# Patient Record
Sex: Male | Born: 1945
Health system: Southern US, Community
[De-identification: ages and names within clinical notes are randomized; demographics above are authoritative.]

## PROBLEM LIST (undated history)

## (undated) DIAGNOSIS — K8021 Calculus of gallbladder without cholecystitis with obstruction: Secondary | ICD-10-CM

## (undated) DIAGNOSIS — E041 Nontoxic single thyroid nodule: Secondary | ICD-10-CM

## (undated) DIAGNOSIS — I252 Old myocardial infarction: Secondary | ICD-10-CM

## (undated) DIAGNOSIS — I82409 Acute embolism and thrombosis of unspecified deep veins of unspecified lower extremity: Secondary | ICD-10-CM

## (undated) DIAGNOSIS — I219 Acute myocardial infarction, unspecified: Secondary | ICD-10-CM

## (undated) DIAGNOSIS — R011 Cardiac murmur, unspecified: Secondary | ICD-10-CM

## (undated) DIAGNOSIS — I10 Essential (primary) hypertension: Secondary | ICD-10-CM

## (undated) DIAGNOSIS — Z8679 Personal history of other diseases of the circulatory system: Secondary | ICD-10-CM

## (undated) DIAGNOSIS — K219 Gastro-esophageal reflux disease without esophagitis: Secondary | ICD-10-CM

## (undated) HISTORY — DX: Old myocardial infarction: I25.2

## (undated) HISTORY — DX: Gastro-esophageal reflux disease without esophagitis: K21.9

## (undated) HISTORY — DX: Calculus of gallbladder without cholecystitis with obstruction: K80.21

## (undated) HISTORY — PX: COLONOSCOPY: SHX174

## (undated) HISTORY — PX: CATARACT EXTRACTION: SUR2

## (undated) HISTORY — PX: APPENDECTOMY: SHX54

## (undated) HISTORY — PX: CORONARY ANGIOPLASTY: SHX604

## (undated) HISTORY — DX: Acute embolism and thrombosis of unspecified deep veins of unspecified lower extremity: I82.409

## (undated) HISTORY — DX: Essential (primary) hypertension: I10

---

## 2002-03-23 DIAGNOSIS — I219 Acute myocardial infarction, unspecified: Secondary | ICD-10-CM

## 2002-03-23 HISTORY — DX: Acute myocardial infarction, unspecified: I21.9

## 2014-03-23 DIAGNOSIS — I81 Portal vein thrombosis: Secondary | ICD-10-CM

## 2014-03-23 HISTORY — DX: Portal vein thrombosis: I81

## 2018-12-08 DIAGNOSIS — R438 Other disturbances of smell and taste: Secondary | ICD-10-CM | POA: Insufficient documentation

## 2018-12-08 DIAGNOSIS — K219 Gastro-esophageal reflux disease without esophagitis: Secondary | ICD-10-CM | POA: Insufficient documentation

## 2019-04-12 ENCOUNTER — Ambulatory Visit: Payer: Medicare Other | Attending: Internal Medicine

## 2019-04-12 DIAGNOSIS — Z23 Encounter for immunization: Secondary | ICD-10-CM | POA: Insufficient documentation

## 2019-05-02 ENCOUNTER — Ambulatory Visit: Payer: Medicare Other | Attending: Internal Medicine

## 2019-05-02 DIAGNOSIS — Z23 Encounter for immunization: Secondary | ICD-10-CM | POA: Insufficient documentation

## 2019-05-02 NOTE — Progress Notes (Signed)
   Covid-19 Vaccination Clinic  Name:  Harshal Sirmon    MRN: [id number] DOB: 1946-01-02  05/02/2019  Mr. Gertner was observed post Covid-19 immunization for 15 minutes without incidence. He was provided with Vaccine Information Sheet and instruction to access the V-Safe system.   Mr. Yellen was instructed to call 911 with any severe reactions post vaccine: Marland Kitchen Difficulty breathing  . Swelling of your face and throat  . A fast heartbeat  . A bad rash all over your body  . Dizziness and weakness    Immunizations Administered    Name Date Dose VIS Date Route   Pfizer COVID-19 Vaccine 05/02/2019  9:32 AM 0.3 mL 03/03/2019 Intramuscular   Manufacturer: [employer], [employer]   Lot: [id number]   NDC: [address]-3

## 2019-05-25 ENCOUNTER — Ambulatory Visit: Payer: Medicare Other | Admitting: Podiatry

## 2019-05-25 ENCOUNTER — Ambulatory Visit (INDEPENDENT_AMBULATORY_CARE_PROVIDER_SITE_OTHER): Payer: Medicare Other

## 2019-05-25 ENCOUNTER — Encounter: Payer: Self-pay | Admitting: Podiatry

## 2019-05-25 ENCOUNTER — Other Ambulatory Visit: Payer: Self-pay

## 2019-05-25 VITALS — BP 132/71 | HR 68 | Temp 98.0°F

## 2019-05-25 DIAGNOSIS — M722 Plantar fascial fibromatosis: Secondary | ICD-10-CM | POA: Diagnosis not present

## 2019-05-25 NOTE — Progress Notes (Signed)
Subjective:   Patient ID: Travis Small, male   DOB: 74 y.o.   MRN: [id number]   HPI Patient presents stating is a small nodule and pain in the plantar aspect of his left arch that is been present.  He is tried stretching exercises shoe gear modifications without relief and states it is moderately tender and he had a history of this in his right foot 5 to 6 years ago.  Patient does not remember foreign body or stepping on anything associated with the nodule and states that around where the tendon is sore.  Patient does not smoke and likes to be active   Review of Systems  All other systems reviewed and are negative.       Objective:  Physical Exam Vitals and nursing note reviewed.  Constitutional:      Appearance: He is well-developed.  Pulmonary:     Effort: Pulmonary effort is normal.  Musculoskeletal:        General: Normal range of motion.  Skin:    General: Skin is warm.  Neurological:     Mental Status: He is alert.     Neurovascular status intact muscle strength adequate range of motion within normal limits.  Patient's plantar arch shows inflammation pain of the fascia itself and a small nodule measuring about 3 x 3 mm within the area which is under the skin and appears to be within subcutaneous tissue.  Patient is found to have good digital perfusion well oriented x3     Assessment:  Inflammatory fasciitis left with possibility for small cystic nodule does not appear to be fibroma agent     Plan:  P reviewed condition and at this time I went ahead did sterile prep and injected the plantar fascia and around the cyst 3 mg Dexasone Kenalog 5 mg Xylocaine and advised on heat therapy and if lesion were to grow in size become more painful or change in color I would recommend excision.  Patient will be seen back as needed  X-rays indicate that there is no signs of spur or calcification associated with this lesion

## 2019-05-25 NOTE — Patient Instructions (Signed)

## 2020-06-03 DIAGNOSIS — I2510 Atherosclerotic heart disease of native coronary artery without angina pectoris: Secondary | ICD-10-CM | POA: Diagnosis not present

## 2020-06-03 DIAGNOSIS — Z7901 Long term (current) use of anticoagulants: Secondary | ICD-10-CM | POA: Diagnosis not present

## 2020-09-16 DIAGNOSIS — Z7901 Long term (current) use of anticoagulants: Secondary | ICD-10-CM | POA: Diagnosis not present

## 2020-11-14 DIAGNOSIS — R001 Bradycardia, unspecified: Secondary | ICD-10-CM | POA: Diagnosis not present

## 2020-11-14 DIAGNOSIS — R42 Dizziness and giddiness: Secondary | ICD-10-CM | POA: Diagnosis not present

## 2020-11-15 ENCOUNTER — Encounter (HOSPITAL_COMMUNITY): Payer: Self-pay

## 2020-11-15 ENCOUNTER — Other Ambulatory Visit: Payer: Self-pay

## 2020-11-15 ENCOUNTER — Emergency Department (HOSPITAL_COMMUNITY)
Admission: EM | Admit: 2020-11-15 | Discharge: 2020-11-15 | Disposition: A | Discharge status: Home / Self Care | Payer: Medicare Other | Attending: Emergency Medicine | Admitting: Emergency Medicine

## 2020-11-15 ENCOUNTER — Emergency Department (HOSPITAL_COMMUNITY): Payer: Medicare Other

## 2020-11-15 DIAGNOSIS — R42 Dizziness and giddiness: Secondary | ICD-10-CM | POA: Diagnosis not present

## 2020-11-15 DIAGNOSIS — Z7982 Long term (current) use of aspirin: Secondary | ICD-10-CM | POA: Diagnosis not present

## 2020-11-15 DIAGNOSIS — Z7901 Long term (current) use of anticoagulants: Secondary | ICD-10-CM | POA: Diagnosis not present

## 2020-11-15 DIAGNOSIS — G319 Degenerative disease of nervous system, unspecified: Secondary | ICD-10-CM | POA: Diagnosis not present

## 2020-11-15 HISTORY — DX: Acute myocardial infarction, unspecified: I21.9

## 2020-11-15 LAB — BASIC METABOLIC PANEL
Anion gap: 6 (ref 5–15)
BUN: 13 mg/dL (ref 8–23)
CO2: 26 mmol/L (ref 22–32)
Calcium: 9.4 mg/dL (ref 8.9–10.3)
Chloride: 107 mmol/L (ref 98–111)
Creatinine, Ser: 1.05 mg/dL (ref 0.61–1.24)
GFR, Estimated: >60 mL/min (ref >60)
Glucose, Bld: 137 mg/dL — ABNORMAL HIGH (ref 70–99)
Potassium: 4 mmol/L (ref 3.5–5.1)
Sodium: 139 mmol/L (ref 135–145)

## 2020-11-15 LAB — CBC
HCT: 43 % (ref 39.0–52.0)
Hemoglobin: 14.9 g/dL (ref 13.0–17.0)
MCH: 30.1 pg (ref 26.0–34.0)
MCHC: 34.7 g/dL (ref 30.0–36.0)
MCV: 86.9 fL (ref 80.0–100.0)
Platelets: 103 10*3/uL — ABNORMAL LOW (ref 150–400)
RBC: 4.95 MIL/uL (ref 4.22–5.81)
RDW: 12.6 % (ref 11.5–15.5)
WBC: 6.5 10*3/uL (ref 4.0–10.5)
nRBC: 0 % (ref 0.0–0.2)

## 2020-11-15 LAB — PROTIME-INR
INR: 2.6 — ABNORMAL HIGH (ref 0.8–1.2)
Prothrombin Time: 28.1 seconds — ABNORMAL HIGH (ref 11.4–15.2)

## 2020-11-15 LAB — CBG MONITORING, ED: Glucose-Capillary: 132 mg/dL — ABNORMAL HIGH (ref 70–99)

## 2020-11-15 MED ORDER — MECLIZINE HCL 25 MG PO TABS: 25.0000 mg | ORAL_TABLET | Freq: Three times a day (TID) | ORAL | 0 refills | Status: DC | PRN

## 2020-11-15 MED ORDER — CARBAMIDE PEROXIDE 6.5 % OT SOLN: 5.0000 [drp] | Freq: Two times a day (BID) | OTIC | 0 refills | Status: AC

## 2020-11-15 MED ORDER — MECLIZINE HCL 25 MG PO TABS
50.0000 mg | ORAL_TABLET | Freq: Once | ORAL | Status: AC
  Administered 2020-11-15: 50 mg via ORAL
  Filled 2020-11-15: qty 2

## 2020-11-15 NOTE — ED Provider Notes (Signed)
[facility] EMERGENCY DEPARTMENT Provider Note   CSN: [id number] Arrival date & time: 11/15/20  1024     History Chief Complaint  Patient presents with   Dizziness    Travis Small is a 75 y.o. male with a history of hypertension, myocardial infarction, clotting disorder, on Coumadin, presenting to the ED with vertigo.  Patient reports abrupt onset of vertigo yesterday afternoon while sitting in a chair slowly rocking his head.  He has never had this feeling before.  It waxes and wanes but does not go away completely.  It is worse with head movement and standing up.  He reports this morning when he got out of bed he was extremely dizzy and felt he was listing towards the right side.  He did not fall or strike his head.  He went to an urgent care and had blood work done yesterday, and was told by his doctors office to come to the ER today if it is worse.  He has not been taking any medications for this.  He denies any change in his hearing, ear pain, ear drainage, ringing.  He denies any headache, blurred vision, nausea, vomiting, chest pain, numbness or weakness of the arms or legs.  He is not having any of the symptoms he had with his heart attack in 2004.  He has been compliant with all of his medications.  HPI     Past Medical History:  Diagnosis Date   Myocardial infarct [facility])     Patient Active Problem List   Diagnosis Date Noted   Acid reflux 12/08/2018   Bad taste in mouth 12/08/2018    No past surgical history on file.     No family history on file.  Social History   Tobacco Use   Smoking status: Never   Smokeless tobacco: Never    Home Medications Prior to Admission medications   Medication Sig Start Date End Date Taking? Authorizing Provider  carbamide peroxide (DEBROX) 6.5 % OTIC solution Place 5 drops into the left ear 2 (two) times daily for 5 days. 11/15/20 11/20/20 Yes Edita Weyenberg, Kermit Balo, MD  meclizine (ANTIVERT) 25 MG tablet Take 1 tablet  (25 mg total) by mouth 3 (three) times daily as needed for up to 30 doses for dizziness. 11/15/20  Yes Terald Sleeper, MD  aspirin 81 MG EC tablet Take by mouth.    [provider]  azithromycin (ZITHROMAX) 250 MG tablet  11/09/18   [provider]  diltiazem (CARDIZEM CD) 240 MG 24 hr capsule Take 240 mg by mouth daily. 05/02/19   [provider]  ezetimibe (ZETIA) 10 MG tablet Take by mouth.    [provider]  famotidine (PEPCID) 20 MG tablet Take 20 mg by mouth at bedtime as needed. 03/23/19   [provider]  losartan (COZAAR) 25 MG tablet Take by mouth.    [provider]  omeprazole (PRILOSEC) 40 MG capsule Take 40 mg by mouth 2 (two) times daily. 03/23/19   [provider]  pantoprazole (PROTONIX) 40 MG tablet  12/08/18   [provider]  warfarin (COUMADIN) 3 MG tablet Take 3 mg by mouth daily. 04/18/19   [provider]  zolpidem (AMBIEN) 10 MG tablet Take 5 mg by mouth at bedtime. 04/14/19   [provider]    Allergies    Erythromycin base  Review of Systems   Review of Systems  Constitutional:  Negative for chills and fever.  HENT:  Negative for ear pain and sore throat.   Eyes:  Negative for pain and visual disturbance.  Respiratory:  Negative for cough and shortness of breath.   Cardiovascular:  Negative for chest pain and palpitations.  Gastrointestinal:  Negative for abdominal pain and vomiting.  Genitourinary:  Negative for dysuria and hematuria.  Musculoskeletal:  Negative for arthralgias and back pain.  Skin:  Negative for color change and rash.  Neurological:  Positive for dizziness. Negative for seizures, syncope, facial asymmetry, weakness, numbness and headaches.  All other systems reviewed and are negative.  Physical Exam Updated Vital Signs BP (!) 145/59   Pulse (!) 55   Temp 98.1 F (36.7 C)   Resp 18   Ht 5' 5.5" (1.664 m)   Wt 77.1 kg   SpO2 98%   BMI 27.86 kg/m    Physical Exam Constitutional:      General: He is not in acute distress. HENT:     Head: Normocephalic and atraumatic.  Eyes:     Conjunctiva/sclera: Conjunctivae normal.     Pupils: Pupils are equal, round, and reactive to light.  Cardiovascular:     Rate and Rhythm: Normal rate and regular rhythm.     Pulses: Normal pulses.  Pulmonary:     Effort: Pulmonary effort is normal. No respiratory distress.  Abdominal:     General: There is no distension.     Tenderness: There is no abdominal tenderness.  Skin:    General: Skin is warm and dry.  Neurological:     General: No focal deficit present.     Mental Status: He is alert and oriented to person, place, and time. Mental status is at baseline.     GCS: GCS eye subscore is 4. GCS verbal subscore is 5. GCS motor subscore is 6.     Cranial Nerves: Cranial nerves are intact.     Sensory: No sensory deficit.     Motor: No weakness.     Coordination: Finger-Nose-Finger Test normal.     Gait: Gait normal.     Comments: Normal test of skew No nystagmus on exam Head impulse test inconclusive   Psychiatric:        Mood and Affect: Mood normal.        Behavior: Behavior normal.    ED Results / Procedures / Treatments   Labs (all labs ordered are listed, but only abnormal results are displayed) Labs Reviewed  BASIC METABOLIC PANEL - Abnormal; Notable for the following components:      Result Value   Glucose, Bld 137 (*)    All other components within normal limits  CBC - Abnormal; Notable for the following components:   Platelets 103 (*)    All other components within normal limits  PROTIME-INR - Abnormal; Notable for the following components:   Prothrombin Time 28.1 (*)    INR 2.6 (*)    All other components within normal limits  CBG MONITORING, ED - Abnormal; Notable for the following components:   Glucose-Capillary 132 (*)    All other components within normal limits  URINALYSIS, ROUTINE W REFLEX MICROSCOPIC     EKG EKG Interpretation  Date/Time:  Friday November 15 2020 10:29:48 EDT Ventricular Rate:  65 PR Interval:  130 QRS Duration: 88 QT Interval:  434 QTC Calculation: 451 R Axis:   88 Text Interpretation: Normal sinus rhythm Normal ECG Confirmed by Alvester Chourifan, Lizzy Hamre [phone]([id number]) on 11/15/2020 2:56:49 PM  Radiology MR BRAIN WO CONTRAST  Result Date: 11/15/2020 CLINICAL  DATA:  Dizziness, persistent/recurrent, cardiac or vascular cause suspected. Hx of clotting disorder, vertigo x 2 days, evaluate for cerebellar stroke. EXAM: MRI HEAD WITHOUT CONTRAST TECHNIQUE: Multiplanar, multiecho pulse sequences of the brain and surrounding structures were obtained without intravenous contrast. COMPARISON:  None. FINDINGS: Brain: There is no evidence of an acute infarct, intracranial hemorrhage, mass, midline shift, or extra-axial fluid collection. T2 hyperintensities in the cerebral white matter and pons are nonspecific but compatible with mild chronic small vessel ischemic disease. T2 hyperintensities throughout the basal ganglia bilaterally predominantly reflect dilated perivascular spaces although there may be a couple of chronic lacunar infarcts as well. There is moderate cerebral atrophy. Vascular: Major intracranial vascular flow voids are preserved. Skull and upper cervical spine: 9 mm T1 hypointense focus in the right parietal skull, indeterminate though possibly a small hemangioma given its appearance on T2 weighted imaging. No additional skull lesions to strongly suggest a more aggressive process such as metastatic disease or myeloma although these are not excluded. Sinuses/Orbits: Bilateral cataract extraction. Clear paranasal sinuses. Trace left mastoid effusion. Other: None. IMPRESSION: 1. No acute intracranial abnormality. 2. Mild chronic small vessel ischemic disease and moderate cerebral atrophy. Electronically Signed   By: Sebastian Ache M.D.   On: 11/15/2020 13:38    Procedures Procedures    Medications Ordered in ED Medications  meclizine (ANTIVERT) tablet 50 mg (50 mg Oral Given 11/15/20 1345)    ED Course  I have reviewed the triage vital signs and the nursing notes.  Pertinent labs & imaging results that were available during my care of the patient were reviewed by me and considered in my medical decision making (see chart for details).  Ddx includes peripheral vertigo including BPPV (most likely given his clinical history) vs CVA vs other  EKG shows borderline sinus bradycardia, which is baseline rhythm.  Did not see evidence of arrhythmia here and his history is not consistent with an arrhythmia.  I reviewed his labs did not show signs of electrolyte deficiencies, anemia, or significant dehydration.  I doubt this is ACS, he has no chest pain or symptoms reminiscent of his prior MI or ACS, and ECG is at baseline.  There is reported history of clotting disorder where he threw "clots all over the organs of my abdomen." This is why he is on Coumadin.  His Coumadin is within therapeutic range.  Although he does not have a headache or any other neurological symptoms, this does raise some concern for cerebellar stroke or embolic stroke.  Discussed an MRI of the patient and his wife and they are in agreement.  This has been ordered.  He is outside the window for tPA or any other intervention, and would likely not be a candidate given that he is on anticoagulation.  Meclizine ordered for vertigo He has very mild symptoms while at rest in the bed.  Clinical Course as of 11/15/20 1737  Fri Nov 15, 2020  1536 He feels significantly better after his meclizine.  We discussed the CT findings.  These all appear to be chronic findings.  I would continue with Coumadin.  I suspect this is likely peripheral vertigo.  I can refer him to ENT. [MT]    Clinical Course User Index [MT] Terald Sleeper, MD   Prior to discharge I reviewed the patient's MRI imaging with him, including chronic  lacunar infarcts and lesion of the parietal brain, possible hemangioma vs bone lesion (?).  Advised discussion with his PCP regarding this.  I do  not see report of brain lesion that would be consistent with central vertigo in this case, and do feel peripheral vertigo remains the most likely cause.   Final Clinical Impression(s) / ED Diagnoses Final diagnoses:  Vertigo    Rx / DC Orders ED Discharge Orders          Ordered    meclizine (ANTIVERT) 25 MG tablet  3 times daily PRN        11/15/20 1538    carbamide peroxide (DEBROX) 6.5 % OTIC solution  2 times daily        11/15/20 1538             Adalina Dopson, Kermit Balo, MD 11/15/20 1739

## 2020-11-15 NOTE — Discharge Instructions (Addendum)
Please follow-up with your primary care doctor, as well as the ear nose and throat specialist for your vertigo.  We talked about taking her time getting up, especially in the morning.  Make sure you sit up on the edge of the bed for several minutes until you are no longer dizzy.  Keep your walker next to you at all times.  You should use the walker for the next 2 days for extra stability.

## 2020-11-15 NOTE — ED Notes (Signed)
Patient transported to MRI 

## 2020-11-15 NOTE — ED Triage Notes (Signed)
Pt endorses dizziness since last night. States he was evaluated yesterday for same. Denies CP, SOB, N/V. No falls or head injury per patient.

## 2020-11-22 DIAGNOSIS — H6120 Impacted cerumen, unspecified ear: Secondary | ICD-10-CM | POA: Diagnosis not present

## 2020-11-22 DIAGNOSIS — H8110 Benign paroxysmal vertigo, unspecified ear: Secondary | ICD-10-CM | POA: Diagnosis not present

## 2020-12-06 DIAGNOSIS — Z23 Encounter for immunization: Secondary | ICD-10-CM | POA: Diagnosis not present

## 2021-01-13 DIAGNOSIS — Z8679 Personal history of other diseases of the circulatory system: Secondary | ICD-10-CM | POA: Diagnosis not present

## 2021-01-13 DIAGNOSIS — H938X3 Other specified disorders of ear, bilateral: Secondary | ICD-10-CM | POA: Diagnosis not present

## 2021-02-04 DIAGNOSIS — Z0000 Encounter for general adult medical examination without abnormal findings: Secondary | ICD-10-CM | POA: Diagnosis not present

## 2021-02-04 DIAGNOSIS — E559 Vitamin D deficiency, unspecified: Secondary | ICD-10-CM | POA: Diagnosis not present

## 2021-02-04 DIAGNOSIS — R7309 Other abnormal glucose: Secondary | ICD-10-CM | POA: Diagnosis not present

## 2021-02-04 DIAGNOSIS — K219 Gastro-esophageal reflux disease without esophagitis: Secondary | ICD-10-CM | POA: Diagnosis not present

## 2021-02-04 DIAGNOSIS — Z7901 Long term (current) use of anticoagulants: Secondary | ICD-10-CM | POA: Diagnosis not present

## 2021-02-04 DIAGNOSIS — I2510 Atherosclerotic heart disease of native coronary artery without angina pectoris: Secondary | ICD-10-CM | POA: Diagnosis not present

## 2021-02-17 DIAGNOSIS — Z86718 Personal history of other venous thrombosis and embolism: Secondary | ICD-10-CM | POA: Diagnosis not present

## 2021-02-17 DIAGNOSIS — Z0000 Encounter for general adult medical examination without abnormal findings: Secondary | ICD-10-CM | POA: Diagnosis not present

## 2021-02-17 DIAGNOSIS — I10 Essential (primary) hypertension: Secondary | ICD-10-CM | POA: Diagnosis not present

## 2021-02-17 DIAGNOSIS — G4700 Insomnia, unspecified: Secondary | ICD-10-CM | POA: Diagnosis not present

## 2021-02-17 DIAGNOSIS — Z7901 Long term (current) use of anticoagulants: Secondary | ICD-10-CM | POA: Diagnosis not present

## 2021-02-17 DIAGNOSIS — K219 Gastro-esophageal reflux disease without esophagitis: Secondary | ICD-10-CM | POA: Diagnosis not present

## 2021-02-17 DIAGNOSIS — I2510 Atherosclerotic heart disease of native coronary artery without angina pectoris: Secondary | ICD-10-CM | POA: Diagnosis not present

## 2021-02-17 DIAGNOSIS — R42 Dizziness and giddiness: Secondary | ICD-10-CM | POA: Diagnosis not present

## 2021-02-27 ENCOUNTER — Other Ambulatory Visit: Payer: Self-pay | Admitting: Family Medicine

## 2021-02-27 DIAGNOSIS — Z86718 Personal history of other venous thrombosis and embolism: Secondary | ICD-10-CM

## 2021-03-06 ENCOUNTER — Ambulatory Visit
Admission: RE | Admit: 2021-03-06 | Discharge: 2021-03-06 | Disposition: A | Discharge status: Home / Self Care | Payer: Medicare Other | Source: Ambulatory Visit | Attending: Family Medicine | Admitting: Family Medicine

## 2021-03-06 DIAGNOSIS — K8020 Calculus of gallbladder without cholecystitis without obstruction: Secondary | ICD-10-CM | POA: Diagnosis not present

## 2021-03-06 DIAGNOSIS — Z8679 Personal history of other diseases of the circulatory system: Secondary | ICD-10-CM | POA: Diagnosis not present

## 2021-03-06 DIAGNOSIS — N281 Cyst of kidney, acquired: Secondary | ICD-10-CM | POA: Diagnosis not present

## 2021-03-06 DIAGNOSIS — Z86718 Personal history of other venous thrombosis and embolism: Secondary | ICD-10-CM | POA: Diagnosis not present

## 2021-04-21 DIAGNOSIS — Z7901 Long term (current) use of anticoagulants: Secondary | ICD-10-CM | POA: Diagnosis not present

## 2021-04-21 DIAGNOSIS — I2510 Atherosclerotic heart disease of native coronary artery without angina pectoris: Secondary | ICD-10-CM | POA: Diagnosis not present

## 2021-07-08 DIAGNOSIS — Z7901 Long term (current) use of anticoagulants: Secondary | ICD-10-CM | POA: Diagnosis not present

## 2021-07-15 DIAGNOSIS — Z7901 Long term (current) use of anticoagulants: Secondary | ICD-10-CM | POA: Diagnosis not present

## 2021-07-30 DIAGNOSIS — Z7901 Long term (current) use of anticoagulants: Secondary | ICD-10-CM | POA: Diagnosis not present

## 2021-08-21 DIAGNOSIS — K8020 Calculus of gallbladder without cholecystitis without obstruction: Secondary | ICD-10-CM | POA: Diagnosis not present

## 2021-08-22 ENCOUNTER — Telehealth: Payer: Self-pay | Admitting: *Deleted

## 2021-08-22 NOTE — Telephone Encounter (Signed)
   Pre-operative Risk Assessment    Patient Name: Travis Small  DOB: Feb 06, 1946 MRN: [id number]   THIS IS ANEW PT REFERRAL FOR PRE OP CLEARANCE. REFERRAL NOTES HAVE BEEN HANDED OVER TO OUR CHART PREP TEAM. PT WILL NEED NEW PT APPT AS WELL.    Request for Surgical Clearance    Procedure:   LAPAROSCOPIC CHOLECYSTECTOMY   Date of Surgery:  Clearance TBD                                 Surgeon:  DR. Reather Laurence Surgeon's Group or Practice Name:  [facility] Phone number:  [phone] Fax number:  [phone] ATTN: Emeline Gins, CMA   Type of Clearance Requested:   - Medical  - Pharmacy:  Hold Warfarin (Coumadin) CLEARANCE NOTES STATES PCP MANAGES [place] A REQUEST TO PCP FOR CLEARANCE ON WARFARIN   Type of Anesthesia:  General    Additional requests/questions:    [name]   08/22/2021, 5:44 PM

## 2021-08-25 ENCOUNTER — Telehealth: Payer: Self-pay

## 2021-08-25 NOTE — Telephone Encounter (Signed)
NOTES SCANNED TO REFERRAL 

## 2021-08-25 NOTE — Telephone Encounter (Signed)
Pt has appt 08/28/21 with DR. Chandrasekhar for pre op clearance. Will forward notes to MD for upcoming appt.  Will send FYI to requesting office the pt has a NEW PT APPT 08/28/21.

## 2021-08-27 NOTE — Progress Notes (Signed)
Cardiology Office Note:    Date:  08/28/2021   ID:  Baldo Ash, DOB 01-Feb-1946, MRN [id number]  Pronounced: Be' reub ee  PCP:  Farris Has, MD   [facility] Providers Cardiologist:  Christell Constant, MD     Referring MD: Farris Has, MD   CC: Pre-op Eval Consulted for the evaluation of pre-op eval at the behest of Dr. Kris Mouton  History of Present Illness:    Travis Small is a 76 y.o. male with a hx of HTN Prior history of MI, DVT, who has cholecystitis. 2004: Was morbid obesity.  Went to the gym and was doing well.  Mowing the lawn and angina.  Had LHC and RCA PCI.  Plaque rupture event.  Has had no issues prior.  Prior statin myalgia; only able to tolerate 5 mg PO Q48 hr.  Patient notes that he is feeling dull pain in the RUQ.  It is tolerable but he is being evaluation for elective cholecystectomy.   Was last feeling well as of today.   Able to take wife to chemo for blood dyscrasia NOS.  Prior anginal equivalent was chest tightness and diaphoresis.  Has had no chest pain, chest pressure, chest tightness, chest stinging .   Patient exertion notable for walking and doing grocery shopping; gardens and feels no symptoms.    No shortness of breath, DOE .  No PND or orthopnea.  No weight gain, leg swelling , or abdominal swelling.  No syncope or near syncope . Notes  no palpitations or funny heart beats.     Had a prior DVT: potentially related to testosterone supplementation.  No further occurrence.  Past Medical History:  Diagnosis Date   Calculus of gallbladder without cholecystitis with obstruction    DVT (deep venous thrombosis) (HCC)    GERD (gastroesophageal reflux disease)    History of myocardial infarction    Hypertension    Myocardial infarct [facility])     Past Surgical History:  Procedure Laterality Date   APPENDECTOMY     CORONARY ANGIOPLASTY      Current Medications: Current Meds  Medication Sig   aspirin 81 MG EC tablet Take by mouth.    diltiazem (CARDIZEM CD) 240 MG 24 hr capsule Take 240 mg by mouth daily.   ezetimibe (ZETIA) 10 MG tablet Take by mouth.   famotidine (PEPCID) 20 MG tablet Take 20 mg by mouth at bedtime as needed for heartburn or indigestion.   losartan (COZAAR) 25 MG tablet Take by mouth.   Melatonin 10 MG CAPS daily.   rosuvastatin (CRESTOR) 5 MG tablet Take 5 mg by mouth every other day.   sildenafil (VIAGRA) 100 MG tablet as needed.   warfarin (COUMADIN) 3 MG tablet Take 3 mg by mouth daily. 1 tab for 5 days, 1.5 tabs Tue & Fri   zolpidem (AMBIEN) 5 MG tablet Take 5 mg by mouth at bedtime.   [DISCONTINUED] pantoprazole (PROTONIX) 40 MG tablet      Allergies:   Erythromycin base, Lisinopril, and Statins   Social History   Socioeconomic History   Marital status: Married    Spouse name: Not on file   Number of children: Not on file   Years of education: Not on file   Highest education level: Not on file  Occupational History   Not on file  Tobacco Use   Smoking status: Never   Smokeless tobacco: Never  Substance and Sexual Activity   Alcohol use: Not on file  Drug use: Not on file   Sexual activity: Not on file  Other Topics Concern   Not on file  Social History Narrative   Not on file   Social Determinants of Health   Financial Resource Strain: Not on file  Food Insecurity: Not on file  Transportation Needs: Not on file  Physical Activity: Not on file  Stress: Not on file  Social Connections: Not on file    Social: From [place], Has kids moved to [facility] for cost of living; Wife sees Dr. Tamala Julian  Family History: The patient's family history includes Colon cancer in his brother; Hyperlipidemia in his father; Hypertension in his father; Stroke in his sister.  ROS:   Please see the history of present illness.     All other systems reviewed and are negative.  EKGs/Labs/Other Studies Reviewed:    The following studies were reviewed today:  EKG:  EKG is  ordered today.  The ekg  ordered today demonstrates  08/28/21: sinus bradycardia  Recent Labs: 11/15/2020: BUN 13; Creatinine, Ser 1.05; Hemoglobin 14.9; Platelets 103; Potassium 4.0; Sodium 139  Recent Lipid Panel No results found for: "CHOL", "TRIG", "HDL", "CHOLHDL", "VLDL", "LDLCALC", "LDLDIRECT"       Physical Exam:    VS:  BP 140/60   Pulse (!) 56   Ht 5' 5.5" (1.664 m)   Wt 163 lb (73.9 kg)   SpO2 98%   BMI 26.71 kg/m     Wt Readings from Last 3 Encounters:  08/28/21 163 lb (73.9 kg)  11/15/20 170 lb (77.1 kg)    Gen: No distress   Neck: No JVD Ears: Bilateral Frank's Sign Cardiac: No Rubs or Gallops, soft systoic murmur, sinus bradycardia, +2 radial pulses Respiratory: Clear to auscultation bilaterally, normal effort, normal  respiratory rate GI: Soft, nontender, non-distended  MS: trace bilateral edema; moves all extremities Integument: Skin feels warm Neuro:  At time of evaluation, alert and oriented to person/place/time/situation  Psych: Normal affect, patient feels fine   ASSESSMENT:    1. Coronary artery disease involving native coronary artery of native heart without angina pectoris   2. Heart murmur, systolic   3. Essential hypertension   4. History of DVT (deep vein thrombosis)   5. Statin intolerance    PLAN:     Coronary Artery Disease; Obstructive HTN Statin Myalgias Complicated by hx of unprovoked DVT (Potentially related to Testosterone) - asymptomatic  - anatomy: RCA disease - because he is being treated to unprovoked DVT (saw Dr. Judie Grieve in [place] [phone]), sees Dr. Orland Mustard here, he doesn't have a strong reason for additional ASA (no history of treatment failure) - ASA can be stopped prior to surgery; I would not start it back - he tells me he is being planned for lovenox bridge; will defer to primary - continue zetia 10 mg and rosuvastatin 5 q48 hours, LDL near 70; at next visit we can discuss PCSK9i and more aggressive LDL goals - continue  diltiazem 240 mg   Preoperative Risk Assessment  - The Revised Cardiac Risk Index = 1 due to CAD  - this which equates to0.9%: low estimated risk of perioperative myocardial infarction, pulmonary edema, ventricular fibrillation, cardiac arrest, or complete heart block.  - 5.72 functional mets - No further cardiac testing is recommended prior to surgery.  - The patient may proceed to surgery at acceptable risk.    History of rheumatic disease NOS New systolic murmur - will get echo; suspect aortic sclerosis vs Mild AS;  this does not need to delay surgery    One year f/u   Medication Adjustments/Labs and Tests Ordered: Current medicines are reviewed at length with the patient today.  Concerns regarding medicines are outlined above.  Orders Placed This Encounter  Procedures   EKG 12-Lead   ECHOCARDIOGRAM COMPLETE   No orders of the defined types were placed in this encounter.   Patient Instructions  Medication Instructions:  Your physician has recommended you make the following change in your medication:  STOP: Aspirin  *If you need a refill on your cardiac medications before your next appointment, please call your pharmacy*   Lab Work: NONE If you have labs (blood work) drawn today and your tests are completely normal, you will receive your results only by: [place] (if you have MyChart) OR A paper copy in the mail If you have any lab test that is abnormal or we need to change your treatment, we will call you to review the results.   Testing/Procedures: Your physician has requested that you have an echocardiogram. Echocardiography is a painless test that uses sound waves to create images of your heart. It provides your doctor with information about the size and shape of your heart and how well your heart's chambers and valves are working. This procedure takes approximately one hour. There are no restrictions for this procedure.    Follow-Up: At [facility],  you and your health needs are our priority.  As part of our continuing mission to provide you with exceptional heart care, we have created designated Provider Care Teams.  These Care Teams include your primary Cardiologist (physician) and Advanced Practice Providers (APPs -  Physician Assistants and Nurse Practitioners) who all work together to provide you with the care you need, when you need it.  We recommend signing up for the patient portal called "MyChart".  Sign up information is provided on this After Visit Summary.  MyChart is used to connect with patients for Virtual Visits (Telemedicine).  Patients are able to view lab/test results, encounter notes, upcoming appointments, etc.  Non-urgent messages can be sent to your provider as well.   To learn more about what you can do with MyChart, go to [email or web address].    Your next appointment:   1 year(s)  The format for your next appointment:   In Person  Provider:   Werner Lean, MD    Important Information About Sugar         Signed, Werner Lean, MD  08/28/2021 10:24 AM    [place]

## 2021-08-28 ENCOUNTER — Encounter: Payer: Self-pay | Admitting: Internal Medicine

## 2021-08-28 ENCOUNTER — Ambulatory Visit: Payer: Medicare Other | Admitting: Internal Medicine

## 2021-08-28 VITALS — BP 140/60 | HR 56 | Ht 65.5 in | Wt 163.0 lb

## 2021-08-28 DIAGNOSIS — Z789 Other specified health status: Secondary | ICD-10-CM

## 2021-08-28 DIAGNOSIS — I2510 Atherosclerotic heart disease of native coronary artery without angina pectoris: Secondary | ICD-10-CM | POA: Diagnosis not present

## 2021-08-28 DIAGNOSIS — R011 Cardiac murmur, unspecified: Secondary | ICD-10-CM | POA: Diagnosis not present

## 2021-08-28 DIAGNOSIS — I10 Essential (primary) hypertension: Secondary | ICD-10-CM | POA: Diagnosis not present

## 2021-08-28 DIAGNOSIS — Z86718 Personal history of other venous thrombosis and embolism: Secondary | ICD-10-CM | POA: Diagnosis not present

## 2021-08-28 NOTE — Patient Instructions (Signed)
Medication Instructions:  Your physician has recommended you make the following change in your medication:  STOP: Aspirin  *If you need a refill on your cardiac medications before your next appointment, please call your pharmacy*   Lab Work: NONE If you have labs (blood work) drawn today and your tests are completely normal, you will receive your results only by: [place] (if you have MyChart) OR A paper copy in the mail If you have any lab test that is abnormal or we need to change your treatment, we will call you to review the results.   Testing/Procedures: Your physician has requested that you have an echocardiogram. Echocardiography is a painless test that uses sound waves to create images of your heart. It provides your doctor with information about the size and shape of your heart and how well your heart's chambers and valves are working. This procedure takes approximately one hour. There are no restrictions for this procedure.    Follow-Up: At [facility], you and your health needs are our priority.  As part of our continuing mission to provide you with exceptional heart care, we have created designated Provider Care Teams.  These Care Teams include your primary Cardiologist (physician) and Advanced Practice Providers (APPs -  Physician Assistants and Nurse Practitioners) who all work together to provide you with the care you need, when you need it.  We recommend signing up for the patient portal called "MyChart".  Sign up information is provided on this After Visit Summary.  MyChart is used to connect with patients for Virtual Visits (Telemedicine).  Patients are able to view lab/test results, encounter notes, upcoming appointments, etc.  Non-urgent messages can be sent to your provider as well.   To learn more about what you can do with MyChart, go to [email or web address].    Your next appointment:   1 year(s)  The format for your next appointment:   In  Person  Provider:   Werner Lean, MD    Important Information About Sugar

## 2021-08-29 DIAGNOSIS — Z86718 Personal history of other venous thrombosis and embolism: Secondary | ICD-10-CM | POA: Diagnosis not present

## 2021-08-29 DIAGNOSIS — Z7901 Long term (current) use of anticoagulants: Secondary | ICD-10-CM | POA: Diagnosis not present

## 2021-08-29 DIAGNOSIS — K8020 Calculus of gallbladder without cholecystitis without obstruction: Secondary | ICD-10-CM | POA: Diagnosis not present

## 2021-08-29 DIAGNOSIS — Z01818 Encounter for other preprocedural examination: Secondary | ICD-10-CM | POA: Diagnosis not present

## 2021-08-29 DIAGNOSIS — R7309 Other abnormal glucose: Secondary | ICD-10-CM | POA: Diagnosis not present

## 2021-10-23 DIAGNOSIS — Z7901 Long term (current) use of anticoagulants: Secondary | ICD-10-CM | POA: Diagnosis not present

## 2021-11-03 DIAGNOSIS — K8020 Calculus of gallbladder without cholecystitis without obstruction: Secondary | ICD-10-CM | POA: Diagnosis not present

## 2021-11-03 DIAGNOSIS — I10 Essential (primary) hypertension: Secondary | ICD-10-CM | POA: Diagnosis not present

## 2021-11-03 DIAGNOSIS — Z86718 Personal history of other venous thrombosis and embolism: Secondary | ICD-10-CM | POA: Diagnosis not present

## 2021-11-03 DIAGNOSIS — I2510 Atherosclerotic heart disease of native coronary artery without angina pectoris: Secondary | ICD-10-CM | POA: Diagnosis not present

## 2021-11-07 ENCOUNTER — Encounter (HOSPITAL_COMMUNITY): Payer: Self-pay | Admitting: Surgery

## 2021-11-07 ENCOUNTER — Other Ambulatory Visit: Payer: Self-pay

## 2021-11-07 NOTE — Progress Notes (Addendum)
For Short Stay: COVID SWAB appointment date: N/A Date of COVID positive in last 90 days: N/A  Bowel Prep reminder: N/A   For Anesthesia: PCP - Farris Has, MD Cardiologist - Christell Constant, MD last office visit note and pre op eval 08/28/21 in epic  Chest x-ray - N/A EKG - 08/28/21 in epic Stress Test - greater than 2 year ECHO - N/A Cardiac Cath -  2004 Pacemaker/ICD device last checked: N/A Pacemaker orders received: N/A Device Rep notified: N/A  Spinal Cord Stimulator: N/A  Sleep Study - N/A CPAP - N/A  Fasting Blood Sugar - N/A Checks Blood Sugar ___N/A__ times a day Date and result of last Hgb A1c-N/A  Blood Thinner Instructions: Coumadin last dose 11/03/2021 2130 Lovenox bridge after procedure Aspirin Instructions: N/A Last Dose:  Activity level:  Able to exercise without chest pain and/or shortness of breath    Anesthesia review: History of MI, HTN, DVT-James Burns P.A. made aware of patient, per Fayrene Fearing no note needed from him at this time.  Patient denies shortness of breath, fever, cough and chest pain at PAT appointment   Patient verbalized understanding of instructions that were given to them at the PAT appointment. Patient was also instructed that they will need to review over the PAT instructions again at home before surgery.

## 2021-11-07 NOTE — Progress Notes (Addendum)
Surgical Instructions                 Your procedure is scheduled on              Report to [facility] Main Entrance "A" at 815 AM, then check in with the Admitting office.             Call this number if you have problems the morning of surgery:             [phone]    If you have any questions prior to your surgery date call [phone]: Open Monday-Friday 4:30 AM-8:00 PM                 Remember:             Do not eat after midnight the night before your surgery   You may drink clear liquids until AM the morning of your surgery.   Clear liquids allowed are: Water, Non-Citrus Juices (without pulp), Carbonated Beverages, Clear Tea, Black Coffee Only (NO MILK, CREAM OR POWDERED CREAMER of any kind), and Gatorade.          If you have questions, please contact your surgeon's office.                             Take these medicines the morning of surgery with A SIP OF WATER Cardizem, Ezetimibe, Omeprazole, Rosuvastatin if that his normal day    As of today, STOP taking any Aspirin (unless otherwise instructed by your surgeon) Aleve, Naproxen, Ibuprofen, Motrin, Advil, Goody's, BC's, all herbal medications, fish oil, and all vitamins.                     Do NOT Smoke (Tobacco/Vaping) for 24 hours prior to your procedure.   If you use a CPAP at night, you may bring your mask/headgear for your overnight stay.   Contacts, glasses, piercing's, hearing aid's, dentures or partials may not be worn into surgery, please bring cases for these belongings.    For patients admitted to the hospital, discharge time will be determined by your treatment team.   Patients discharged the day of surgery will not be allowed to drive home, and someone needs to stay with them for 24 hours.   SURGICAL WAITING ROOM VISITATION Patients having surgery or a procedure may have no more than 2 support people in the waiting area - these visitors may rotate.   Children under the age of 33 must have an adult  with them who is not the patient. If the patient needs to stay at the hospital during part of their recovery, the visitor guidelines for inpatient rooms apply. Pre-op nurse will coordinate an appropriate time for 1 support person to accompany patient in pre-op.  This support person may not rotate.    Please refer to the [facility] website for the visitor guidelines for Inpatients (after your surgery is over and you are in a regular room).      Do not wear jewelry  Do not wear lotions, powders, colognes, or deodorant. Men may shave face and neck. Do not bring valuables to the hospital. [facility] is not responsible for any belongings or valuables.   Wear Clean/Comfortable clothing the morning of surgery  Remember to brush your teeth WITH YOUR REGULAR TOOTHPASTE.   Please read over the following fact sheets that you were given.  If you received a COVID test during your pre-op visit  it is requested that you wear a mask when out in public, stay away from anyone that may not be feeling well and notify your surgeon if you develop symptoms. If you have been in contact with anyone that has tested positive in the last 10 days please notify you surgeon.

## 2021-11-10 ENCOUNTER — Encounter (HOSPITAL_COMMUNITY): Payer: Self-pay | Admitting: Surgery

## 2021-11-10 ENCOUNTER — Ambulatory Visit (HOSPITAL_COMMUNITY): Payer: Medicare Other | Admitting: Physician Assistant

## 2021-11-10 ENCOUNTER — Ambulatory Visit (HOSPITAL_COMMUNITY)
Admission: RE | Admit: 2021-11-10 | Discharge: 2021-11-21 | Disposition: E | Payer: Medicare Other | Attending: Surgery | Admitting: Surgery

## 2021-11-10 ENCOUNTER — Ambulatory Visit (HOSPITAL_COMMUNITY): Payer: Medicare Other

## 2021-11-10 ENCOUNTER — Other Ambulatory Visit: Payer: Self-pay

## 2021-11-10 ENCOUNTER — Encounter (HOSPITAL_COMMUNITY): Admission: RE | Disposition: E | Payer: Self-pay | Source: Home / Self Care | Attending: Surgery

## 2021-11-10 ENCOUNTER — Ambulatory Visit (HOSPITAL_BASED_OUTPATIENT_CLINIC_OR_DEPARTMENT_OTHER): Payer: Medicare Other | Admitting: Physician Assistant

## 2021-11-10 DIAGNOSIS — D689 Coagulation defect, unspecified: Secondary | ICD-10-CM | POA: Diagnosis not present

## 2021-11-10 DIAGNOSIS — K8010 Calculus of gallbladder with chronic cholecystitis without obstruction: Secondary | ICD-10-CM | POA: Diagnosis not present

## 2021-11-10 DIAGNOSIS — Y92234 Operating room of hospital as the place of occurrence of the external cause: Secondary | ICD-10-CM | POA: Insufficient documentation

## 2021-11-10 DIAGNOSIS — Z86718 Personal history of other venous thrombosis and embolism: Secondary | ICD-10-CM | POA: Insufficient documentation

## 2021-11-10 DIAGNOSIS — I97711 Intraoperative cardiac arrest during other surgery: Secondary | ICD-10-CM | POA: Diagnosis not present

## 2021-11-10 DIAGNOSIS — K8020 Calculus of gallbladder without cholecystitis without obstruction: Secondary | ICD-10-CM

## 2021-11-10 DIAGNOSIS — K5939 Other megacolon: Secondary | ICD-10-CM | POA: Diagnosis not present

## 2021-11-10 DIAGNOSIS — I10 Essential (primary) hypertension: Secondary | ICD-10-CM | POA: Diagnosis not present

## 2021-11-10 DIAGNOSIS — K9161 Intraoperative hemorrhage and hematoma of a digestive system organ or structure complicating a digestive sytem procedure: Secondary | ICD-10-CM | POA: Diagnosis not present

## 2021-11-10 DIAGNOSIS — Z7901 Long term (current) use of anticoagulants: Secondary | ICD-10-CM | POA: Insufficient documentation

## 2021-11-10 DIAGNOSIS — Y838 Other surgical procedures as the cause of abnormal reaction of the patient, or of later complication, without mention of misadventure at the time of the procedure: Secondary | ICD-10-CM | POA: Diagnosis not present

## 2021-11-10 DIAGNOSIS — I9752 Accidental puncture and laceration of a circulatory system organ or structure during other procedure: Secondary | ICD-10-CM | POA: Diagnosis not present

## 2021-11-10 DIAGNOSIS — I252 Old myocardial infarction: Secondary | ICD-10-CM | POA: Insufficient documentation

## 2021-11-10 DIAGNOSIS — I2510 Atherosclerotic heart disease of native coronary artery without angina pectoris: Secondary | ICD-10-CM | POA: Insufficient documentation

## 2021-11-10 DIAGNOSIS — I9742 Intraoperative hemorrhage and hematoma of a circulatory system organ or structure complicating other procedure: Secondary | ICD-10-CM | POA: Diagnosis not present

## 2021-11-10 DIAGNOSIS — K6389 Other specified diseases of intestine: Secondary | ICD-10-CM | POA: Diagnosis not present

## 2021-11-10 DIAGNOSIS — Y658 Other specified misadventures during surgical and medical care: Secondary | ICD-10-CM | POA: Diagnosis not present

## 2021-11-10 HISTORY — DX: Personal history of other diseases of the circulatory system: Z86.79

## 2021-11-10 HISTORY — DX: Cardiac murmur, unspecified: R01.1

## 2021-11-10 HISTORY — PX: CHOLECYSTECTOMY: SHX55

## 2021-11-10 HISTORY — DX: Nontoxic single thyroid nodule: E04.1

## 2021-11-10 LAB — BASIC METABOLIC PANEL
Anion gap: 11 (ref 5–15)
BUN: 18 mg/dL (ref 8–23)
CO2: 21 mmol/L — ABNORMAL LOW (ref 22–32)
Calcium: 8.9 mg/dL (ref 8.9–10.3)
Chloride: 106 mmol/L (ref 98–111)
Creatinine, Ser: 1.2 mg/dL (ref 0.61–1.24)
GFR, Estimated: >60 mL/min (ref >60)
Glucose, Bld: 121 mg/dL — ABNORMAL HIGH (ref 70–99)
Potassium: 3.9 mmol/L (ref 3.5–5.1)
Sodium: 138 mmol/L (ref 135–145)

## 2021-11-10 LAB — POCT I-STAT 7, (LYTES, BLD GAS, ICA,H+H)
Acid-Base Excess: 10 mmol/L — ABNORMAL HIGH (ref 0.0–2.0)
Acid-base deficit: 6 mmol/L — ABNORMAL HIGH (ref 0.0–2.0)
Acid-base deficit: 6 mmol/L — ABNORMAL HIGH (ref 0.0–2.0)
Acid-base deficit: 15 mmol/L — ABNORMAL HIGH (ref 0.0–2.0)
Bicarbonate: 21.5 mmol/L (ref 20.0–28.0)
Bicarbonate: 19.6 mmol/L — ABNORMAL LOW (ref 20.0–28.0)
Bicarbonate: 13.4 mmol/L — ABNORMAL LOW (ref 20.0–28.0)
Bicarbonate: 40.8 mmol/L — ABNORMAL HIGH (ref 20.0–28.0)
Calcium, Ion: 0.45 mmol/L — CL (ref 1.15–1.40)
Calcium, Ion: 0.37 mmol/L — CL (ref 1.15–1.40)
Calcium, Ion: 1.67 mmol/L (ref 1.15–1.40)
Calcium, Ion: <0.3 mmol/L — CL (ref 1.15–1.40)
HCT: 16 % — ABNORMAL LOW (ref 39.0–52.0)
HCT: 22 % — ABNORMAL LOW (ref 39.0–52.0)
HCT: 18 % — ABNORMAL LOW (ref 39.0–52.0)
HCT: 33 % — ABNORMAL LOW (ref 39.0–52.0)
Hemoglobin: 5.4 g/dL — CL (ref 13.0–17.0)
Hemoglobin: 7.5 g/dL — ABNORMAL LOW (ref 13.0–17.0)
Hemoglobin: 11.2 g/dL — ABNORMAL LOW (ref 13.0–17.0)
Hemoglobin: 6.1 g/dL — CL (ref 13.0–17.0)
O2 Saturation: 99 %
O2 Saturation: 99 %
O2 Saturation: 78 %
O2 Saturation: 99 %
Patient temperature: 34.3
Patient temperature: 34.3
Patient temperature: 34.2
Patient temperature: 34.4
Potassium: 5 mmol/L (ref 3.5–5.1)
Potassium: 5.1 mmol/L (ref 3.5–5.1)
Potassium: 5.6 mmol/L — ABNORMAL HIGH (ref 3.5–5.1)
Potassium: 6.7 mmol/L (ref 3.5–5.1)
Sodium: 146 mmol/L — ABNORMAL HIGH (ref 135–145)
Sodium: 144 mmol/L (ref 135–145)
Sodium: 143 mmol/L (ref 135–145)
Sodium: 161 mmol/L (ref 135–145)
TCO2: 23 mmol/L (ref 22–32)
TCO2: 21 mmol/L — ABNORMAL LOW (ref 22–32)
TCO2: 15 mmol/L — ABNORMAL LOW (ref 22–32)
TCO2: 44 mmol/L — ABNORMAL HIGH (ref 22–32)
pCO2 arterial: 51 mmHg — ABNORMAL HIGH (ref 32–48)
pCO2 arterial: 33.2 mmHg (ref 32–48)
pCO2 arterial: 38.1 mmHg (ref 32–48)
pCO2 arterial: 90.1 mmHg (ref 32–48)
pH, Arterial: 7.218 — ABNORMAL LOW (ref 7.35–7.45)
pH, Arterial: 7.366 (ref 7.35–7.45)
pH, Arterial: 7.137 — CL (ref 7.35–7.45)
pH, Arterial: 7.25 — ABNORMAL LOW (ref 7.35–7.45)
pO2, Arterial: 163 mmHg — ABNORMAL HIGH (ref 83–108)
pO2, Arterial: 154 mmHg — ABNORMAL HIGH (ref 83–108)
pO2, Arterial: 191 mmHg — ABNORMAL HIGH (ref 83–108)
pO2, Arterial: 46 mmHg — ABNORMAL LOW (ref 83–108)

## 2021-11-10 LAB — ABO/RH: ABO/RH(D): AB POS

## 2021-11-10 LAB — CBC
HCT: 40.2 % (ref 39.0–52.0)
Hemoglobin: 13.4 g/dL (ref 13.0–17.0)
MCH: 28.8 pg (ref 26.0–34.0)
MCHC: 33.3 g/dL (ref 30.0–36.0)
MCV: 86.5 fL (ref 80.0–100.0)
Platelets: 107 10*3/uL — ABNORMAL LOW (ref 150–400)
RBC: 4.65 MIL/uL (ref 4.22–5.81)
RDW: 13.2 % (ref 11.5–15.5)
WBC: 6.3 10*3/uL (ref 4.0–10.5)
nRBC: 0 % (ref 0.0–0.2)

## 2021-11-10 LAB — PROTIME-INR
INR: 1.2 (ref 0.8–1.2)
Prothrombin Time: 15.1 seconds (ref 11.4–15.2)

## 2021-11-10 LAB — PREPARE RBC (CROSSMATCH)

## 2021-11-10 SURGERY — LAPAROSCOPIC CHOLECYSTECTOMY
Anesthesia: General

## 2021-11-10 MED ORDER — HEMOSTATIC AGENTS (NO CHARGE) OPTIME
TOPICAL | Status: DC | PRN
  Administered 2021-11-10: 1 via TOPICAL

## 2021-11-10 MED ORDER — ONDANSETRON HCL 4 MG/2ML IJ SOLN
INTRAMUSCULAR | Status: AC
  Filled 2021-11-10: qty 2

## 2021-11-10 MED ORDER — FENTANYL CITRATE (PF) 250 MCG/5ML IJ SOLN
INTRAMUSCULAR | Status: AC
  Filled 2021-11-10: qty 5

## 2021-11-10 MED ORDER — SODIUM BICARBONATE 8.4 % IV SOLN: INTRAVENOUS | Status: DC | PRN

## 2021-11-10 MED ORDER — NOREPINEPHRINE 4 MG/250ML-% IV SOLN
INTRAVENOUS | Status: DC | PRN
  Administered 2021-11-10: 10 ug/min via INTRAVENOUS

## 2021-11-10 MED ORDER — EPINEPHRINE 1 MG/10ML IJ SOSY
PREFILLED_SYRINGE | INTRAMUSCULAR | Status: DC | PRN
  Administered 2021-11-10 (×4): 1 mg via INTRAVENOUS
  Administered 2021-11-10: .1 mg via INTRAVENOUS
  Administered 2021-11-10 (×6): 1 mg via INTRAVENOUS
  Administered 2021-11-10: .1 mg via INTRAVENOUS

## 2021-11-10 MED ORDER — VASOPRESSIN 20 UNIT/ML IV SOLN
INTRAVENOUS | Status: AC
  Filled 2021-11-10: qty 1

## 2021-11-10 MED ORDER — HEPARIN 6000 UNIT IRRIGATION SOLUTION
Status: AC
  Filled 2021-11-10: qty 500

## 2021-11-10 MED ORDER — COAGULATION FACTOR VIIA RECOMB 1 MG IV SOLR
90.0000 ug/kg | INTRAVENOUS | Status: AC
  Administered 2021-11-10: 7 mg via INTRAVENOUS
  Filled 2021-11-10: qty 2

## 2021-11-10 MED ORDER — ORAL CARE MOUTH RINSE
15.0000 mL | Freq: Once | OROMUCOSAL | Status: AC
Start: 2021-11-10 — End: 2021-11-10

## 2021-11-10 MED ORDER — PROPOFOL 10 MG/ML IV BOLUS
INTRAVENOUS | Status: AC
  Filled 2021-11-10: qty 20

## 2021-11-10 MED ORDER — EPHEDRINE 5 MG/ML INJ
INTRAVENOUS | Status: AC
  Filled 2021-11-10: qty 15

## 2021-11-10 MED ORDER — LACTATED RINGERS IV SOLN: INTRAVENOUS | Status: DC | PRN

## 2021-11-10 MED ORDER — BUPIVACAINE-EPINEPHRINE (PF) 0.25% -1:200000 IJ SOLN
INTRAMUSCULAR | Status: AC
  Filled 2021-11-10: qty 30

## 2021-11-10 MED ORDER — VASOPRESSIN 20 UNIT/ML IV SOLN
INTRAVENOUS | Status: DC | PRN
  Administered 2021-11-10 (×3): 1 [IU] via INTRAVENOUS
  Administered 2021-11-10: 3 [IU] via INTRAVENOUS
  Administered 2021-11-10 (×3): 1 [IU] via INTRAVENOUS
  Administered 2021-11-10 (×2): 3 [IU] via INTRAVENOUS
  Administered 2021-11-10: 1 [IU] via INTRAVENOUS
  Administered 2021-11-10: 3 [IU] via INTRAVENOUS
  Administered 2021-11-10: 1 [IU] via INTRAVENOUS
  Administered 2021-11-10: 2 [IU] via INTRAVENOUS

## 2021-11-10 MED ORDER — DEXTROSE 50 % IV SOLN
INTRAVENOUS | Status: DC | PRN
  Administered 2021-11-10: 1 via INTRAVENOUS

## 2021-11-10 MED ORDER — BUPIVACAINE HCL 0.25 % IJ SOLN
INTRAMUSCULAR | Status: DC | PRN
  Administered 2021-11-10: 13 mL

## 2021-11-10 MED ORDER — TRANEXAMIC ACID 1000 MG/10ML IV SOLN
1.5000 mg/kg/h | INTRAVENOUS | Status: AC
  Administered 2021-11-10: 1.5 mg/kg/h via INTRAVENOUS
  Filled 2021-11-10: qty 25

## 2021-11-10 MED ORDER — PHENYLEPHRINE HCL (PRESSORS) 10 MG/ML IV SOLN
INTRAVENOUS | Status: DC | PRN
  Administered 2021-11-10 (×2): 160 ug via INTRAVENOUS
  Administered 2021-11-10: 80 ug via INTRAVENOUS
  Administered 2021-11-10: 160 ug via INTRAVENOUS
  Administered 2021-11-10: 80 ug via INTRAVENOUS
  Administered 2021-11-10 (×5): 160 ug via INTRAVENOUS

## 2021-11-10 MED ORDER — TRANEXAMIC ACID-NACL 1000-0.7 MG/100ML-% IV SOLN
INTRAVENOUS | Status: AC
Start: 2021-11-10 — End: ?
  Filled 2021-11-10: qty 100

## 2021-11-10 MED ORDER — SODIUM CHLORIDE 0.9 % IV SOLN: 10.0000 mL/h | Freq: Once | INTRAVENOUS | Status: DC

## 2021-11-10 MED ORDER — PHENYLEPHRINE 80 MCG/ML (10ML) SYRINGE FOR IV PUSH (FOR BLOOD PRESSURE SUPPORT)
PREFILLED_SYRINGE | INTRAVENOUS | Status: AC
Start: 2021-11-10 — End: ?
  Filled 2021-11-10: qty 20

## 2021-11-10 MED ORDER — ROCURONIUM BROMIDE 10 MG/ML (PF) SYRINGE
PREFILLED_SYRINGE | INTRAVENOUS | Status: AC
Start: 2021-11-10 — End: ?
  Filled 2021-11-10: qty 10

## 2021-11-10 MED ORDER — CEFAZOLIN SODIUM-DEXTROSE 2-3 GM-%(50ML) IV SOLR
INTRAVENOUS | Status: DC | PRN
  Administered 2021-11-10: 2 g via INTRAVENOUS

## 2021-11-10 MED ORDER — VASOPRESSIN 20 UNITS/100 ML INFUSION FOR SHOCK
INTRAVENOUS | Status: DC | PRN
  Administered 2021-11-10: .04 [IU]/h via INTRAVENOUS

## 2021-11-10 MED ORDER — CHLORHEXIDINE GLUCONATE 0.12 % MT SOLN
OROMUCOSAL | Status: AC
  Administered 2021-11-10: 15 mL via OROMUCOSAL
  Filled 2021-11-10: qty 15

## 2021-11-10 MED ORDER — DEXAMETHASONE SODIUM PHOSPHATE 10 MG/ML IJ SOLN
INTRAMUSCULAR | Status: DC | PRN
  Administered 2021-11-10: 10 mg via INTRAVENOUS

## 2021-11-10 MED ORDER — DEXMEDETOMIDINE HCL IN NACL 80 MCG/20ML IV SOLN
INTRAVENOUS | Status: AC
  Filled 2021-11-10: qty 20

## 2021-11-10 MED ORDER — TRANEXAMIC ACID-NACL 1000-0.7 MG/100ML-% IV SOLN
INTRAVENOUS | Status: DC | PRN
  Administered 2021-11-10: 1000 mg via INTRAVENOUS

## 2021-11-10 MED ORDER — CALCIUM CHLORIDE 10 % IV SOLN
INTRAVENOUS | Status: DC | PRN
  Administered 2021-11-10: 100 mg via INTRAVENOUS
  Administered 2021-11-10: 200 mg via INTRAVENOUS
  Administered 2021-11-10: 500 mg via INTRAVENOUS
  Administered 2021-11-10: 1000 mg via INTRAVENOUS
  Administered 2021-11-10: 200 mg via INTRAVENOUS
  Administered 2021-11-10: 100 mg via INTRAVENOUS
  Administered 2021-11-10 (×3): 1000 mg via INTRAVENOUS
  Administered 2021-11-10: 500 mg via INTRAVENOUS
  Administered 2021-11-10 (×2): 1000 mg via INTRAVENOUS
  Administered 2021-11-10: 300 mg via INTRAVENOUS
  Administered 2021-11-10: 1000 mg via INTRAVENOUS
  Administered 2021-11-10: 300 mg via INTRAVENOUS
  Administered 2021-11-10 (×2): 1000 mg via INTRAVENOUS
  Administered 2021-11-10: 100 mg via INTRAVENOUS
  Administered 2021-11-10: 500 mg via INTRAVENOUS
  Administered 2021-11-10: 200 mg via INTRAVENOUS
  Administered 2021-11-10: 500 mg via INTRAVENOUS
  Administered 2021-11-10: 100 mg via INTRAVENOUS
  Administered 2021-11-10: 400 mg via INTRAVENOUS
  Administered 2021-11-10: 1000 mg via INTRAVENOUS

## 2021-11-10 MED ORDER — MIDAZOLAM HCL 2 MG/2ML IJ SOLN
INTRAMUSCULAR | Status: AC
Start: 2021-11-10 — End: ?
  Filled 2021-11-10: qty 2

## 2021-11-10 MED ORDER — ALBUMIN HUMAN 5 % IV SOLN: INTRAVENOUS | Status: DC | PRN

## 2021-11-10 MED ORDER — FENTANYL CITRATE (PF) 250 MCG/5ML IJ SOLN
INTRAMUSCULAR | Status: DC | PRN
  Administered 2021-11-10: 100 ug via INTRAVENOUS

## 2021-11-10 MED ORDER — HEPARIN 6000 UNIT IRRIGATION SOLUTION
Status: DC | PRN
  Administered 2021-11-10: 1

## 2021-11-10 MED ORDER — PHENYLEPHRINE HCL-NACL 20-0.9 MG/250ML-% IV SOLN
INTRAVENOUS | Status: DC | PRN
  Administered 2021-11-10: 100 ug/min via INTRAVENOUS
  Administered 2021-11-10: 150 ug/min via INTRAVENOUS

## 2021-11-10 MED ORDER — ROCURONIUM BROMIDE 10 MG/ML (PF) SYRINGE
PREFILLED_SYRINGE | INTRAVENOUS | Status: AC
  Filled 2021-11-10: qty 10

## 2021-11-10 MED ORDER — CEFAZOLIN SODIUM-DEXTROSE 2-4 GM/100ML-% IV SOLN
INTRAVENOUS | Status: AC
  Filled 2021-11-10: qty 100

## 2021-11-10 MED ORDER — LACTATED RINGERS IV SOLN: INTRAVENOUS | Status: DC

## 2021-11-10 MED ORDER — ATROPINE SULFATE 0.4 MG/ML IV SOLN
INTRAVENOUS | Status: AC
  Filled 2021-11-10: qty 1

## 2021-11-10 MED ORDER — SODIUM BICARBONATE 8.4 % IV SOLN
INTRAVENOUS | Status: AC
  Filled 2021-11-10: qty 50

## 2021-11-10 MED ORDER — INSULIN ASPART 100 UNIT/ML IJ SOLN
INTRAMUSCULAR | Status: DC | PRN
  Administered 2021-11-10: 10 [IU] via SUBCUTANEOUS

## 2021-11-10 MED ORDER — CALCIUM CHLORIDE 10 % IV SOLN
INTRAVENOUS | Status: AC
  Filled 2021-11-10: qty 20

## 2021-11-10 MED ORDER — DEXAMETHASONE SODIUM PHOSPHATE 10 MG/ML IJ SOLN
INTRAMUSCULAR | Status: AC
  Filled 2021-11-10: qty 1

## 2021-11-10 MED ORDER — CHLORHEXIDINE GLUCONATE 0.12 % MT SOLN: 15.0000 mL | Freq: Once | OROMUCOSAL | Status: DC

## 2021-11-10 MED ORDER — SUCCINYLCHOLINE CHLORIDE 200 MG/10ML IV SOSY
PREFILLED_SYRINGE | INTRAVENOUS | Status: AC
Start: 2021-11-10 — End: ?
  Filled 2021-11-10: qty 10

## 2021-11-10 MED ORDER — MIDAZOLAM HCL 2 MG/2ML IJ SOLN
INTRAMUSCULAR | Status: AC
  Filled 2021-11-10: qty 2

## 2021-11-10 MED ORDER — ORAL CARE MOUTH RINSE: 15.0000 mL | Freq: Once | OROMUCOSAL | Status: DC

## 2021-11-10 MED ORDER — CHLORHEXIDINE GLUCONATE 0.12 % MT SOLN
15.0000 mL | Freq: Once | OROMUCOSAL | Status: AC
Start: 2021-11-10 — End: 2021-11-10

## 2021-11-10 MED ORDER — KETOROLAC TROMETHAMINE 30 MG/ML IJ SOLN
INTRAMUSCULAR | Status: AC
  Filled 2021-11-10: qty 1

## 2021-11-10 MED ORDER — EPINEPHRINE 1 MG/10ML IJ SOSY
PREFILLED_SYRINGE | INTRAMUSCULAR | Status: AC
Start: 2021-11-10 — End: ?
  Filled 2021-11-10: qty 20

## 2021-11-10 MED ORDER — 0.9 % SODIUM CHLORIDE (POUR BTL) OPTIME
TOPICAL | Status: DC | PRN
  Administered 2021-11-10: 2000 mL

## 2021-11-10 MED ORDER — EPINEPHRINE PF 1 MG/ML IJ SOLN: INTRAMUSCULAR | Status: DC | PRN

## 2021-11-10 MED ORDER — EPINEPHRINE HCL 5 MG/250ML IV SOLN IN NS
INTRAVENOUS | Status: DC | PRN
  Administered 2021-11-10: 2 ug/min via INTRAVENOUS

## 2021-11-10 MED ORDER — LIDOCAINE 2% (20 MG/ML) 5 ML SYRINGE
INTRAMUSCULAR | Status: DC | PRN
  Administered 2021-11-10: 60 mg via INTRAVENOUS

## 2021-11-10 MED ORDER — ROCURONIUM BROMIDE 10 MG/ML (PF) SYRINGE
PREFILLED_SYRINGE | INTRAVENOUS | Status: DC | PRN
  Administered 2021-11-10: 70 mg via INTRAVENOUS
  Administered 2021-11-10: 30 mg via INTRAVENOUS
  Administered 2021-11-10: 100 mg via INTRAVENOUS

## 2021-11-10 MED ORDER — MIDAZOLAM HCL 2 MG/2ML IJ SOLN
INTRAMUSCULAR | Status: DC | PRN
  Administered 2021-11-10 (×2): 2 mg via INTRAVENOUS
  Administered 2021-11-10: 1 mg via INTRAVENOUS

## 2021-11-10 MED ORDER — LIDOCAINE 2% (20 MG/ML) 5 ML SYRINGE
INTRAMUSCULAR | Status: AC
  Filled 2021-11-10: qty 5

## 2021-11-10 MED ORDER — EPHEDRINE SULFATE-NACL 50-0.9 MG/10ML-% IV SOSY
PREFILLED_SYRINGE | INTRAVENOUS | Status: DC | PRN
  Administered 2021-11-10: 5 mg via INTRAVENOUS
  Administered 2021-11-10 (×5): 10 mg via INTRAVENOUS

## 2021-11-10 MED ORDER — SODIUM BICARBONATE 8.4 % IV SOLN
INTRAVENOUS | Status: DC | PRN
  Administered 2021-11-10 (×11): 50 meq via INTRAVENOUS

## 2021-11-10 MED ORDER — PROPOFOL 10 MG/ML IV BOLUS
INTRAVENOUS | Status: DC | PRN
  Administered 2021-11-10: 110 mg via INTRAVENOUS

## 2021-11-10 SURGICAL SUPPLY — 69 items
ADH SKN CLS APL DERMABOND .7 (GAUZE/BANDAGES/DRESSINGS) ×1
AGENT HMST 10 BLLW SHRT CANN (HEMOSTASIS) ×1
APL PRP STRL LF DISP 70% ISPRP (MISCELLANEOUS) ×1
APPLIER CLIP 13 LRG OPEN (CLIP) ×1
APPLIER CLIP 5 13 M/L LIGAMAX5 (MISCELLANEOUS) ×1
APR CLP LRG 13 20 CLIP (CLIP) ×1
APR CLP MED LRG 5 ANG JAW (MISCELLANEOUS) ×1
BAG SPEC RTRVL 10 TROC 200 (ENDOMECHANICALS) ×1
CANISTER SUCT 3000ML PPV (MISCELLANEOUS) ×1 IMPLANT
CANISTER WOUND CARE 500ML ATS (WOUND CARE) IMPLANT
CHLORAPREP W/TINT 26 (MISCELLANEOUS) ×1 IMPLANT
CLIP APPLIE 13 LRG OPEN (CLIP) IMPLANT
CLIP APPLIE 5 13 M/L LIGAMAX5 (MISCELLANEOUS) ×1 IMPLANT
CLIP TI LARGE 6 (CLIP) IMPLANT
COVER SURGICAL LIGHT HANDLE (MISCELLANEOUS) ×1 IMPLANT
DERMABOND ADVANCED (GAUZE/BANDAGES/DRESSINGS) ×1
DERMABOND ADVANCED .7 DNX12 (GAUZE/BANDAGES/DRESSINGS) ×1 IMPLANT
DISSECTOR BLUNT TIP ENDO 5MM (MISCELLANEOUS) IMPLANT
ELECT CAUTERY BLADE 6.4 (BLADE) ×1 IMPLANT
ELECT PAD DSPR THERM+ ADLT (MISCELLANEOUS) IMPLANT
ELECT REM PT RETURN 9FT ADLT (ELECTROSURGICAL) ×1
ELECTRODE REM PT RTRN 9FT ADLT (ELECTROSURGICAL) ×1 IMPLANT
FELT TEFLON 1X6 (MISCELLANEOUS) IMPLANT
GLOVE BIO SURGEON STRL SZ 6.5 (GLOVE) ×1 IMPLANT
GLOVE BIOGEL PI IND STRL 6 (GLOVE) ×1 IMPLANT
GLOVE BIOGEL PI INDICATOR 6 (GLOVE) ×1
GOWN STRL REUS W/ TWL LRG LVL3 (GOWN DISPOSABLE) ×3 IMPLANT
GOWN STRL REUS W/TWL LRG LVL3 (GOWN DISPOSABLE) ×3
HEMOSTAT HEMOBLAST BELLOWS (HEMOSTASIS) IMPLANT
HEMOSTAT SURGICEL 2X14 (HEMOSTASIS) IMPLANT
INSERT FOGARTY SM (MISCELLANEOUS) IMPLANT
KIT BASIN OR (CUSTOM PROCEDURE TRAY) ×1 IMPLANT
KIT TURNOVER KIT B (KITS) ×1 IMPLANT
NS IRRIG 1000ML POUR BTL (IV SOLUTION) ×1 IMPLANT
PAD ARMBOARD 7.5X6 YLW CONV (MISCELLANEOUS) ×1 IMPLANT
PENCIL BUTTON HOLSTER BLD 10FT (ELECTRODE) ×1 IMPLANT
POUCH RETRIEVAL ECOSAC 10 (ENDOMECHANICALS) ×1 IMPLANT
POUCH RETRIEVAL ECOSAC 10MM (ENDOMECHANICALS) ×1
SCISSORS LAP 5X35 DISP (ENDOMECHANICALS) ×1 IMPLANT
SEALANT PATCH FIBRIN 2X4IN (MISCELLANEOUS) IMPLANT
SET IRRIG TUBING LAPAROSCOPIC (IRRIGATION / IRRIGATOR) IMPLANT
SET MICROPUNCTURE 5F STIFF (MISCELLANEOUS) IMPLANT
SET TUBE SMOKE EVAC HIGH FLOW (TUBING) ×1 IMPLANT
SHEATH PINNACLE 8F 10CM (SHEATH) IMPLANT
SLEEVE ENDOPATH XCEL 5M (ENDOMECHANICALS) ×2 IMPLANT
SLEEVE Z-THREAD 5X100MM (TROCAR) IMPLANT
SPONGE ABD ABTHERA ADVANCE (MISCELLANEOUS) IMPLANT
SPONGE ABDOMINAL VAC ABTHERA (MISCELLANEOUS) IMPLANT
SPONGE T-LAP 18X18 ~~LOC~~+RFID (SPONGE) IMPLANT
STAPLER VISISTAT 35W (STAPLE) IMPLANT
SUT CHROMIC 0 CT 1 36 (SUTURE) IMPLANT
SUT ETHILON 2 LR (SUTURE) IMPLANT
SUT MNCRL AB 4-0 PS2 18 (SUTURE) ×1 IMPLANT
SUT PROLENE 0 CT 1 30 (SUTURE) IMPLANT
SUT PROLENE 3 0 SH 1 (SUTURE) IMPLANT
SUT PROLENE 3 0 SH 48 (SUTURE) IMPLANT
SUT PROLENE 3 0 SH DA (SUTURE) IMPLANT
SUT PROLENE 3 0 SH1 36 (SUTURE) IMPLANT
SUT PROLENE 4 0 RB 1 (SUTURE) ×10
SUT PROLENE 4-0 RB1 .5 CRCL 36 (SUTURE) IMPLANT
SUT PROLENE 4-0 RB1 18X2 ARM (SUTURE) IMPLANT
SUT VIC AB 0 UR5 27 (SUTURE) IMPLANT
SUT VICRYL 0 AB UR-6 (SUTURE) IMPLANT
SYR BULB IRRIG 60ML STRL (SYRINGE) IMPLANT
TOWEL GREEN STERILE FF (TOWEL DISPOSABLE) ×1 IMPLANT
TRAY LAPAROSCOPIC MC (CUSTOM PROCEDURE TRAY) ×1 IMPLANT
TROCAR XCEL BLUNT TIP 100MML (ENDOMECHANICALS) ×1 IMPLANT
TROCAR Z-THREAD OPTICAL 5X100M (TROCAR) ×1 IMPLANT
WATER STERILE IRR 1000ML POUR (IV SOLUTION) ×1 IMPLANT

## 2021-11-11 ENCOUNTER — Encounter (HOSPITAL_COMMUNITY): Payer: Self-pay | Admitting: Surgery

## 2021-11-11 LAB — BPAM FFP
Blood Product Expiration Date: 202308242359
Blood Product Expiration Date: 202308242359
Blood Product Expiration Date: 202308242359
Blood Product Expiration Date: 202308242359
Blood Product Expiration Date: 202308262359
Blood Product Expiration Date: 202308262359
Blood Product Expiration Date: 202308262359
Blood Product Expiration Date: 202308262359
Blood Product Expiration Date: 202308262359
Blood Product Expiration Date: 202308262359
Blood Product Expiration Date: 202308262359
Blood Product Expiration Date: 202308262359
Blood Product Expiration Date: 202308262359
Blood Product Expiration Date: 202308262359
Blood Product Expiration Date: 202308262359
Blood Product Expiration Date: 202308262359
Blood Product Expiration Date: 202308262359
Blood Product Expiration Date: 202308262359
Blood Product Expiration Date: 202308262359
Blood Product Expiration Date: 202308262359
Blood Product Expiration Date: 202308262359
Blood Product Expiration Date: 202308262359
Blood Product Expiration Date: 202308262359
Blood Product Expiration Date: 202308262359
Blood Product Expiration Date: 202308262359
Blood Product Expiration Date: 202308262359
Blood Product Expiration Date: 202308262359
Blood Product Expiration Date: 202308262359
Blood Product Expiration Date: 202308262359
Blood Product Expiration Date: 202308262359
Blood Product Expiration Date: 202308262359
Blood Product Expiration Date: 202308262359
Blood Product Expiration Date: 202308262359
Blood Product Expiration Date: 202308262359
Blood Product Expiration Date: 202308262359
Blood Product Expiration Date: 202308262359
Blood Product Expiration Date: 202308262359
Blood Product Expiration Date: 202308262359
Blood Product Expiration Date: 202308262359
Blood Product Expiration Date: 202308262359
Blood Product Expiration Date: 202308262359
Blood Product Expiration Date: 202308262359
Blood Product Expiration Date: 202308262359
Blood Product Expiration Date: 202308312359
Blood Product Expiration Date: 202308312359
Blood Product Expiration Date: 202309072359
Blood Product Expiration Date: 202309072359
Blood Product Expiration Date: 202309092359
Blood Product Expiration Date: 202309092359
Blood Product Expiration Date: 202309092359
ISSUE DATE / TIME: 202308211341
ISSUE DATE / TIME: 202308211341
ISSUE DATE / TIME: 202308211341
ISSUE DATE / TIME: 202308211341
ISSUE DATE / TIME: 202308211341
ISSUE DATE / TIME: 202308211341
ISSUE DATE / TIME: 202308211341
ISSUE DATE / TIME: 202308211341
ISSUE DATE / TIME: 202308211405
ISSUE DATE / TIME: 202308211405
ISSUE DATE / TIME: 202308211405
ISSUE DATE / TIME: 202308211405
ISSUE DATE / TIME: 202308211416
ISSUE DATE / TIME: 202308211416
ISSUE DATE / TIME: 202308211416
ISSUE DATE / TIME: 202308211416
ISSUE DATE / TIME: 202308211416
ISSUE DATE / TIME: 202308211416
ISSUE DATE / TIME: 202308211416
ISSUE DATE / TIME: 202308211416
ISSUE DATE / TIME: 202308211439
ISSUE DATE / TIME: 202308211439
ISSUE DATE / TIME: 202308211439
ISSUE DATE / TIME: 202308211440
ISSUE DATE / TIME: 202308211447
ISSUE DATE / TIME: 202308211447
ISSUE DATE / TIME: 202308211447
ISSUE DATE / TIME: 202308211447
ISSUE DATE / TIME: 202308211517
ISSUE DATE / TIME: 202308211517
ISSUE DATE / TIME: 202308211517
ISSUE DATE / TIME: 202308211522
ISSUE DATE / TIME: 202308211522
ISSUE DATE / TIME: 202308211522
ISSUE DATE / TIME: 202308211531
ISSUE DATE / TIME: 202308211531
ISSUE DATE / TIME: 202308211531
ISSUE DATE / TIME: 202308211531
ISSUE DATE / TIME: 202308211531
ISSUE DATE / TIME: 202308211531
ISSUE DATE / TIME: 202308211551
ISSUE DATE / TIME: 202308211551
ISSUE DATE / TIME: 202308211603
ISSUE DATE / TIME: 202308211603
ISSUE DATE / TIME: 202308211603
ISSUE DATE / TIME: 202308211603
ISSUE DATE / TIME: 202308211603
ISSUE DATE / TIME: 202308211603
ISSUE DATE / TIME: 202308211610
ISSUE DATE / TIME: 202308211610
Unit Type and Rh: 600
Unit Type and Rh: 600
Unit Type and Rh: 600
Unit Type and Rh: 600
Unit Type and Rh: 6200
Unit Type and Rh: 6200
Unit Type and Rh: 6200
Unit Type and Rh: 6200
Unit Type and Rh: 6200
Unit Type and Rh: 6200
Unit Type and Rh: 6200
Unit Type and Rh: 6200
Unit Type and Rh: 6200
Unit Type and Rh: 6200
Unit Type and Rh: 6200
Unit Type and Rh: 6200
Unit Type and Rh: 6200
Unit Type and Rh: 6200
Unit Type and Rh: 6200
Unit Type and Rh: 6200
Unit Type and Rh: 6200
Unit Type and Rh: 6200
Unit Type and Rh: 6200
Unit Type and Rh: 6200
Unit Type and Rh: 6200
Unit Type and Rh: 6200
Unit Type and Rh: 6200
Unit Type and Rh: 6200
Unit Type and Rh: 6200
Unit Type and Rh: 6200
Unit Type and Rh: 6200
Unit Type and Rh: 6200
Unit Type and Rh: 6200
Unit Type and Rh: 6200
Unit Type and Rh: 8400
Unit Type and Rh: 8400
Unit Type and Rh: 8400
Unit Type and Rh: 8400
Unit Type and Rh: 8400
Unit Type and Rh: 8400
Unit Type and Rh: 8400
Unit Type and Rh: 8400
Unit Type and Rh: 8400
Unit Type and Rh: 8400
Unit Type and Rh: 8400
Unit Type and Rh: 8400
Unit Type and Rh: 8400
Unit Type and Rh: 8400
Unit Type and Rh: 8400
Unit Type and Rh: 8400

## 2021-11-11 LAB — PREPARE CRYOPRECIPITATE
Unit division: 0
Unit division: 0
Unit division: 0
Unit division: 0

## 2021-11-11 LAB — TYPE AND SCREEN
ABO/RH(D): AB POS
Antibody Screen: NEGATIVE
Unit division: 0
Unit division: 0
Unit division: 0
Unit division: 0
Unit division: 0
Unit division: 0
Unit division: 0
Unit division: 0
Unit division: 0
Unit division: 0
Unit division: 0
Unit division: 0
Unit division: 0
Unit division: 0
Unit division: 0
Unit division: 0
Unit division: 0
Unit division: 0
Unit division: 0
Unit division: 0
Unit division: 0
Unit division: 0
Unit division: 0
Unit division: 0
Unit division: 0
Unit division: 0
Unit division: 0
Unit division: 0
Unit division: 0
Unit division: 0
Unit division: 0
Unit division: 0
Unit division: 0
Unit division: 0
Unit division: 0
Unit division: 0
Unit division: 0
Unit division: 0
Unit division: 0
Unit division: 0
Unit division: 0
Unit division: 0
Unit division: 0
Unit division: 0
Unit division: 0
Unit division: 0
Unit division: 0
Unit division: 0
Unit division: 0
Unit division: 0
Unit division: 0
Unit division: 0

## 2021-11-11 LAB — PREPARE FRESH FROZEN PLASMA
Unit division: 0
Unit division: 0
Unit division: 0
Unit division: 0
Unit division: 0
Unit division: 0
Unit division: 0
Unit division: 0
Unit division: 0
Unit division: 0
Unit division: 0
Unit division: 0
Unit division: 0
Unit division: 0
Unit division: 0
Unit division: 0
Unit division: 0
Unit division: 0
Unit division: 0
Unit division: 0
Unit division: 0
Unit division: 0
Unit division: 0
Unit division: 0
Unit division: 0
Unit division: 0
Unit division: 0
Unit division: 0
Unit division: 0
Unit division: 0
Unit division: 0
Unit division: 0
Unit division: 0
Unit division: 0
Unit division: 0
Unit division: 0
Unit division: 0
Unit division: 0
Unit division: 0
Unit division: 0

## 2021-11-11 LAB — BPAM RBC
Blood Product Expiration Date: 202309082359
Blood Product Expiration Date: 202309082359
Blood Product Expiration Date: 202309082359
Blood Product Expiration Date: 202309082359
Blood Product Expiration Date: 202309082359
Blood Product Expiration Date: 202309092359
Blood Product Expiration Date: 202309092359
Blood Product Expiration Date: 202309092359
Blood Product Expiration Date: 202309092359
Blood Product Expiration Date: 202309092359
Blood Product Expiration Date: 202309092359
Blood Product Expiration Date: 202309092359
Blood Product Expiration Date: 202309092359
Blood Product Expiration Date: 202309092359
Blood Product Expiration Date: 202309092359
Blood Product Expiration Date: 202309092359
Blood Product Expiration Date: 202309092359
Blood Product Expiration Date: 202309092359
Blood Product Expiration Date: 202309092359
Blood Product Expiration Date: 202309092359
Blood Product Expiration Date: 202309092359
Blood Product Expiration Date: 202309092359
Blood Product Expiration Date: 202309102359
Blood Product Expiration Date: 202309102359
Blood Product Expiration Date: 202309102359
Blood Product Expiration Date: 202309102359
Blood Product Expiration Date: 202309102359
Blood Product Expiration Date: 202309112359
Blood Product Expiration Date: 202309112359
Blood Product Expiration Date: 202309112359
Blood Product Expiration Date: 202309112359
Blood Product Expiration Date: 202309112359
Blood Product Expiration Date: 202309122359
Blood Product Expiration Date: 202309122359
Blood Product Expiration Date: 202309132359
Blood Product Expiration Date: 202309162359
Blood Product Expiration Date: 202309162359
Blood Product Expiration Date: 202309162359
Blood Product Expiration Date: 202309162359
Blood Product Expiration Date: 202309192359
Blood Product Expiration Date: 202309262359
Blood Product Expiration Date: 202309262359
Blood Product Expiration Date: 202309262359
Blood Product Expiration Date: 202309262359
Blood Product Expiration Date: 202309272359
Blood Product Expiration Date: 202309272359
Blood Product Expiration Date: 202309272359
Blood Product Expiration Date: 202309272359
Blood Product Expiration Date: 202309272359
Blood Product Expiration Date: 202309272359
Blood Product Expiration Date: 202309272359
Blood Product Expiration Date: 202309272359
ISSUE DATE / TIME: 202308211254
ISSUE DATE / TIME: 202308211254
ISSUE DATE / TIME: 202308211254
ISSUE DATE / TIME: 202308211254
ISSUE DATE / TIME: 202308211331
ISSUE DATE / TIME: 202308211331
ISSUE DATE / TIME: 202308211331
ISSUE DATE / TIME: 202308211331
ISSUE DATE / TIME: 202308211331
ISSUE DATE / TIME: 202308211331
ISSUE DATE / TIME: 202308211331
ISSUE DATE / TIME: 202308211331
ISSUE DATE / TIME: 202308211403
ISSUE DATE / TIME: 202308211403
ISSUE DATE / TIME: 202308211403
ISSUE DATE / TIME: 202308211403
ISSUE DATE / TIME: 202308211403
ISSUE DATE / TIME: 202308211403
ISSUE DATE / TIME: 202308211403
ISSUE DATE / TIME: 202308211403
ISSUE DATE / TIME: 202308211415
ISSUE DATE / TIME: 202308211415
ISSUE DATE / TIME: 202308211415
ISSUE DATE / TIME: 202308211415
ISSUE DATE / TIME: 202308211415
ISSUE DATE / TIME: 202308211415
ISSUE DATE / TIME: 202308211415
ISSUE DATE / TIME: 202308211415
ISSUE DATE / TIME: 202308211427
ISSUE DATE / TIME: 202308211427
ISSUE DATE / TIME: 202308211427
ISSUE DATE / TIME: 202308211427
ISSUE DATE / TIME: 202308211427
ISSUE DATE / TIME: 202308211427
ISSUE DATE / TIME: 202308211427
ISSUE DATE / TIME: 202308211427
ISSUE DATE / TIME: 202308211432
ISSUE DATE / TIME: 202308211432
ISSUE DATE / TIME: 202308211432
ISSUE DATE / TIME: 202308211432
ISSUE DATE / TIME: 202308211432
ISSUE DATE / TIME: 202308211432
ISSUE DATE / TIME: 202308211432
ISSUE DATE / TIME: 202308211432
ISSUE DATE / TIME: 202308211530
ISSUE DATE / TIME: 202308211530
ISSUE DATE / TIME: 202308211530
ISSUE DATE / TIME: 202308211530
ISSUE DATE / TIME: 202308211530
ISSUE DATE / TIME: 202308211530
ISSUE DATE / TIME: 202308211530
ISSUE DATE / TIME: 202308211530
Unit Type and Rh: 5100
Unit Type and Rh: 5100
Unit Type and Rh: 5100
Unit Type and Rh: 5100
Unit Type and Rh: 5100
Unit Type and Rh: 5100
Unit Type and Rh: 5100
Unit Type and Rh: 5100
Unit Type and Rh: 5100
Unit Type and Rh: 5100
Unit Type and Rh: 5100
Unit Type and Rh: 5100
Unit Type and Rh: 6200
Unit Type and Rh: 6200
Unit Type and Rh: 6200
Unit Type and Rh: 6200
Unit Type and Rh: 6200
Unit Type and Rh: 6200
Unit Type and Rh: 6200
Unit Type and Rh: 6200
Unit Type and Rh: 6200
Unit Type and Rh: 6200
Unit Type and Rh: 6200
Unit Type and Rh: 6200
Unit Type and Rh: 6200
Unit Type and Rh: 6200
Unit Type and Rh: 6200
Unit Type and Rh: 6200
Unit Type and Rh: 6200
Unit Type and Rh: 6200
Unit Type and Rh: 6200
Unit Type and Rh: 6200
Unit Type and Rh: 6200
Unit Type and Rh: 6200
Unit Type and Rh: 6200
Unit Type and Rh: 6200
Unit Type and Rh: 6200
Unit Type and Rh: 6200
Unit Type and Rh: 6200
Unit Type and Rh: 6200
Unit Type and Rh: 6200
Unit Type and Rh: 6200
Unit Type and Rh: 6200
Unit Type and Rh: 6200
Unit Type and Rh: 6200
Unit Type and Rh: 6200
Unit Type and Rh: 6200
Unit Type and Rh: 6200
Unit Type and Rh: 6200
Unit Type and Rh: 6200
Unit Type and Rh: 6200
Unit Type and Rh: 6200

## 2021-11-11 LAB — BPAM CRYOPRECIPITATE
Blood Product Expiration Date: 202308211938
Blood Product Expiration Date: 202308212000
Blood Product Expiration Date: 202308212047
Blood Product Expiration Date: 202308212135
ISSUE DATE / TIME: 202308211408
ISSUE DATE / TIME: 202308211420
ISSUE DATE / TIME: 202308211448
ISSUE DATE / TIME: 202308211538
Unit Type and Rh: 6200
Unit Type and Rh: 6200
Unit Type and Rh: 6200
Unit Type and Rh: 6200

## 2021-11-11 LAB — PREPARE PLATELET PHERESIS
Unit division: 0
Unit division: 0
Unit division: 0

## 2021-11-11 LAB — BPAM PLATELET PHERESIS
Blood Product Expiration Date: 202308232359
Blood Product Expiration Date: 202308232359
Blood Product Expiration Date: 202308232359
ISSUE DATE / TIME: 202308211341
ISSUE DATE / TIME: 202308211406
ISSUE DATE / TIME: 202308211407
Unit Type and Rh: 6200
Unit Type and Rh: 6200
Unit Type and Rh: 6200

## 2021-11-11 LAB — SURGICAL PATHOLOGY

## 2021-11-12 MED FILL — Sodium Chloride IV Soln 0.9%: INTRAVENOUS | Qty: 2000 | Status: AC

## 2021-11-12 MED FILL — Sodium Chloride Irrigation Soln 0.9%: Qty: 3000 | Status: AC

## 2021-11-12 MED FILL — Heparin Sodium (Porcine) Inj 1000 Unit/ML: INTRAMUSCULAR | Qty: 60 | Status: AC

## 2021-11-21 NOTE — Anesthesia Postprocedure Evaluation (Signed)
Anesthesia Post Note  Patient: Travis Small  Procedure(s) Performed: LAPAROSCOPIC CHOLECYSTECTOMY     Patient location during evaluation: Other (OR) Anesthesia Type: General Anesthetic complications: yes    Patient passed away in the OR during the procedure.         Dede Dobesh  11/13/2021 5:15 PM

## 2021-11-21 NOTE — Transfer of Care (Addendum)
Immediate Anesthesia Transfer of Care Note  Patient: Travis Small  Procedure(s) Performed: LAPAROSCOPIC CHOLECYSTECTOMY  Patient Location: Pt deceased  Anesthesia Type General Level of Consciousness:Pt deceased  Airway & Oxygen Therapy: Pt deceased  Post-op Assessment: Pt deceased Post vital signs: Pt deceased  Last Vitals:  Vitals Value Taken Time  BP    Temp    Pulse    Resp    SpO2      Last Pain:  Vitals:   27-Nov-2021 0944  TempSrc:   PainSc: 0-No pain         Complications:  Encounter Notable Events  Notable Event Outcome Phase Comment  Death  Intraprocedure

## 2021-11-21 NOTE — Anesthesia Procedure Notes (Addendum)
Procedure Name: Intubation Date/Time: 11-20-21 12:19 PM  Performed by: Michele Rockers, CRNAPre-anesthesia Checklist: Patient identified, Patient being monitored, Timeout performed, Emergency Drugs available and Suction available Patient Re-evaluated:Patient Re-evaluated prior to induction Oxygen Delivery Method: Circle system utilized Preoxygenation: Pre-oxygenation with 100% oxygen Induction Type: IV induction Ventilation: Mask ventilation without difficulty Laryngoscope Size: Mac and 3 Grade View: Grade III Tube type: Oral Tube size: 7.5 mm Number of attempts: 1 Airway Equipment and Method: Stylet Placement Confirmation: ETT inserted through vocal cords under direct vision, positive ETCO2 and breath sounds checked- equal and bilateral Secured at: 22 cm Tube secured with: Tape Dental Injury: Teeth and Oropharynx as per pre-operative assessment

## 2021-11-21 NOTE — H&P (Signed)
Travis Small is an 76 y.o. male.   HPI: 20M with symptomatic cholelithiasis. Plan for lap chole. The patient has had no hospitalizations, ER visits, surgeries, or newly diagnosed allergies since being seen in the office. Has seen his PCP and his cardiologist as part of his pre-op workup.   Past Medical History:  Diagnosis Date   Calculus of gallbladder without cholecystitis with obstruction    DVT (deep venous thrombosis) (HCC)    GERD (gastroesophageal reflux disease)    Heart murmur    Systolic   History of rheumatic fever as a child    Hypertension    Myocardial infarct [facility]) 2004   Portal vein thrombosis 2016   Thyroid nodule     Past Surgical History:  Procedure Laterality Date   APPENDECTOMY     age 40   CATARACT EXTRACTION Bilateral    COLONOSCOPY     CORONARY ANGIOPLASTY      Family History  Problem Relation Age of Onset   Hyperlipidemia Father    Hypertension Father    Stroke Sister    Colon cancer Brother     Social History:  reports that he has never smoked. He has never used smokeless tobacco. He reports that he does not drink alcohol and does not use drugs.  Allergies:  Allergies  Allergen Reactions   Erythromycin Base Nausea And Vomiting    Upset stomach   Lisinopril Cough   Statins     muscle pain, tolerates rosuvastatin in low doses     Medications: I have reviewed the patient's current medications.  Results for orders placed or performed during the hospital encounter of 2021-11-14 (from the past 48 hour(s))  PT-INR Day of Surgery per protocol     Status: None   Collection Time: Nov 14, 2021  9:40 AM  Result Value Ref Range   Prothrombin Time 15.1 11.4 - 15.2 seconds   INR 1.2 0.8 - 1.2    Comment: (NOTE) INR goal varies based on device and disease states. Performed at [facility], [address], [place], [place] [address]     No results found.  ROS 10 point review of systems is negative except as listed above in HPI.    Physical Exam Blood pressure (!) 140/65, pulse 69, temperature 97.9 F (36.6 C), temperature source Oral, resp. rate 20, height 5' 5.5" (1.664 m), weight 73.9 kg, SpO2 99 %. Constitutional: well-developed, well-nourished HEENT: pupils equal, round, reactive to light, 13mm b/l, moist conjunctiva, external inspection of ears and nose normal, hearing intact Oropharynx: normal oropharyngeal mucosa, poor dentition Neck: no thyromegaly, trachea midline, no midline cervical tenderness to palpation Chest: breath sounds equal bilaterally, normal respiratory effort, no midline or lateral chest wall tenderness to palpation/deformity Abdomen: soft, NT, no bruising, no hepatosplenomegaly GU: no blood at urethral meatus of penis, no scrotal masses or abnormality  Back: no wounds, no thoracic/lumbar spine tenderness to palpation, no thoracic/lumbar spine stepoffs Rectal: deferred Extremities: 2+ radial and pedal pulses bilaterally, intact motor and sensation bilateral UE and LE, no peripheral edema MSK: unable to assess gait/station, no clubbing/cyanosis of fingers/toes, normal ROM of all four extremities Skin: warm, dry, no rashes Psych: normal memory, normal mood/affect     Assessment/Plan: 25M with symptomatic cholelithiasis. Plan for lap chole. Informed consent was obtained after detailed explanation of risks, including bleeding, infection, biloma, hematoma, injury to common bile duct, need for IOC to delineate anatomy, and need for conversion to open procedure. All questions answered to the  patient's satisfaction.   Diamantina Monks, MD General and Trauma Surgery [facility]

## 2021-11-21 NOTE — Op Note (Signed)
    NAME: Travis Small    MRN: [id number] DOB: 1945/05/05    DATE OF OPERATION: 12/05/21  PREOP DIAGNOSIS:    Hemorrhage  POSTOP DIAGNOSIS:    Same  PROCEDURE:    Hemorrhage control Portal vein repair   SURGEON: Victorino Sparrow  ASSIST: Dr. Kris Mouton, Dr. Sherald Hess, Dr. Violeta Gelinas  ANESTHESIA: General    INDICATIONS:    Travis Small is a 76 y.o. male scheduled for elective laparoscopic cholecystectomy.  Case was complicated by hemorrhage requiring conversion to open.  Vascular surgery was called for assistance.  FINDINGS:   Diffuse, coagulopathic bleeding from liver parenchyma, portal vein injury, multiple collaterals appreciated.  TECHNIQUE:   Vascular surgery was called urgently to the OR due to hemorrhage.  Upon assessing the liver, there appeared to be injuries appreciated at the hilum.  Vascular clamps were brought onto the field and the hepatodoudenal ligament was clamped in an effort to reduce hemorrhage.  My partner Dr. Sherald Hess came in for assistance.  3-0 Prolene suture was used in an effort to control bleeding.  Multiple venotomies were appreciated which were oversewn using 3-0 Prolene suture.  The patient was severely coagulopathic due to massive transfusion protocol.  During resuscitation, multiple forms of thrombin product were used in an effort to promote clot.  Unfortunately, during resuscitation, the patient went into PEA arrest.  Chest compressions were started.  Intraoperative TEE and rhythm strip demonstrated concern for massive cardiac event.  After over 30 minutes of CPR, the case was called and the patient expired.   Ladonna Snide, MD [facility] DATE OF DICTATION:   12-05-2021

## 2021-11-21 NOTE — Anesthesia Procedure Notes (Signed)
Central Venous Catheter Insertion Performed by: Oleta Mouse, MD, anesthesiologist Start/EndSeptember 01, 2023 1:44 PM, 2021-11-21 1:54 PM Patient location: OR. Emergency situation Preanesthetic checklist: patient identified, monitors and equipment checked and anesthesia consent Position: supine Catheter size: 9 Fr Total catheter length 10. MAC introducer Procedure performed using ultrasound guided technique. Ultrasound Notes:anatomy identified, needle tip was noted to be adjacent to the nerve/plexus identified, no ultrasound evidence of intravascular and/or intraneural injection and image(s) printed for medical record Attempts: 1 Following insertion, line sutured. Post procedure assessment: blood return through all ports, free fluid flow and no air  Patient tolerated the procedure well with no immediate complications.

## 2021-11-21 NOTE — OR Nursing (Signed)
1553 Begin CPR  !620 pt pronounced deceased by Dr. Bedelia Person

## 2021-11-21 NOTE — OR Nursing (Signed)
Procedure converted to open.

## 2021-11-21 NOTE — Anesthesia Preprocedure Evaluation (Signed)
Anesthesia Evaluation  Patient identified by MRN, date of birth, ID band Patient awake    Reviewed: Allergy & Precautions, NPO status , Patient's Chart, lab work & pertinent test results  History of Anesthesia Complications Negative for: history of anesthetic complications  Airway Mallampati: III  TM Distance: >3 FB Neck ROM: Full    Dental  (+) Dental Advisory Given   Pulmonary neg pulmonary ROS, neg sleep apnea, neg COPD,    breath sounds clear to auscultation       Cardiovascular hypertension, (-) angina+ CAD, + Past MI and + DVT   Rhythm:Regular  2004: Had LHC and RCA PCI.  Plaque rupture event.    Cleared for surgery by cardiology 08/2021  Cough with ACEI statin myalgia; only able to tolerate 5 mg PO Q48 hr.    TTE pending to evaluate heart murmur   Neuro/Psych neg Seizures negative neurological ROS  negative psych ROS   GI/Hepatic GERD  Medicated,CHOLELITHIASIS  No results found for: "ALT", "AST", "GGT", "ALKPHOS", "BILITOT"    Endo/Other  negative endocrine ROSNo results found for: "HGBA1C"   Renal/GU Lab Results      Component                Value               Date                      CREATININE               1.20                12-06-21                Musculoskeletal negative musculoskeletal ROS (+)   Abdominal   Peds  Hematology Coumadin for h/o DVT, held prior to surgery  Lab Results      Component                Value               Date                      INR                      1.2                 Dec 06, 2021                INR                      2.6 (H)             11/15/2020            Hgb 13.4   Anesthesia Other Findings   Reproductive/Obstetrics                             Anesthesia Physical Anesthesia Plan  ASA: 2  Anesthesia Plan: General   Post-op Pain Management: Ofirmev IV (intra-op)*   Induction: Intravenous  PONV Risk Score and Plan:  2 and Ondansetron and Dexamethasone  Airway Management Planned: Oral ETT  Additional Equipment: None  Intra-op Plan:   Post-operative Plan: Extubation in OR  Informed Consent: I have reviewed the patients History and Physical, chart, labs and discussed the procedure including the risks, benefits and alternatives for the  proposed anesthesia with the patient or authorized representative who has indicated his/her understanding and acceptance.     Dental advisory given  Plan Discussed with: CRNA  Anesthesia Plan Comments:         Anesthesia Quick Evaluation

## 2021-11-21 NOTE — Op Note (Signed)
   Operative Note  Date: 19-Nov-2021  Procedure: laparoscopic cholecystectomy converted to open cholecystectomy, ACLS; hemorrhage control   Pre-op diagnosis: symptomatic cholelithiasis Post-op diagnosis: same  Indication and clinical history: The patient is a 76 y.o. year old male with symptomatic cholelithiasis  Surgeon: Diamantina Monks, MD Assistant: Violeta Gelinas, MD; Almond Lint, MD; Gillis Santa, MD; Sherald Hess, MD  Anesthesiologist: Maple Hudson, MD; Jean Rosenthal, MD Anesthesia: General  Findings:  Specimen: gallbladder EBL: extensive blood loss Product administered: 42 pRBC, 42 FFP, 2 platelet, 4 cryo Drains/Implants: none  Disposition:  deceased  Description of procedure: The patient was positioned supine on the operating room table. Time-out was performed verifying correct patient, procedure, signature of informed consent, and administration of pre-operative antibiotics. General anesthetic induction and intubation were uneventful. The abdomen was prepped and draped in the usual sterile fashion. An infra-umbilical incision was made using an open technique using zero vicryl stay sutures on either side of the fascia and a 21mm Hassan port inserted. After establishing pneumoperitoneum, which the patient tolerated well, the abdominal cavity was inspected and no injury of any intra-abdominal structures was identified. Additional ports were placed under direct visualization and using local anesthetic: two 73mm ports in the right subcostal region and a 55mm port in the epigastric region. The patient was re-positioned to reverse Trendelenburg and right side up. Adhesiolysis was performed to expose the gallbladder, which was then retracted cephalad. The infundibulum was identified and retracted toward the right lower quadrant. The peritoneum was incised over the infundibulum and the triangle of Calot dissected. Large volume bleeding was immediately encountered just below the infundibulum. Further  dissection was able to clearly expose the cystic duct and artery, both of which were isolated, doubly clipped, and divided. Immediately posterior to these structures was a the posterior wall of a large venous structure at its bifurcation. Hemorrhage control was obtained with a clamp and the procedure was converted to open to obtain definitive hemorrhage control. A subcostal incision was made and deepened through the fascia. Partial manual control of bleeding was obtained and the massive transfusion protocol begun. The gallbladder was dissected off the liver bed to improve visualization and sent as a specimen. Continued efforts were made at hemorrhage control and intra-operative consultation of vascular surgery was performed. It was unclear exactly which vessels were bleeding, however due to his history of portal venous thrombosis, it is possible that large collateralized veins were the source. There were continued efforts at vascular control, none of which were definitive. The patient was coagulopathic and no visible clotting was identified. He was bleeding from all surfaces despite suture ligation, electrocautery, and manual pressure. Ultimately, he went into cardiac arrest and with TEE visualization, it was clear that he had no LV contractility. Despite nearly 30 minutes of high quality CPR, including chest compressions, defibrillation, and continued MTP, ROSC was unable to be obtained and time of death was called at 1619.   Diamantina Monks, MD General and Trauma Surgery [facility]

## 2021-11-21 NOTE — Anesthesia Procedure Notes (Addendum)
Arterial Line Insertion Start/End09-15-2023 12:59 PM, 2021/12/05 1:04 PM Performed by: Val Eagle, MD, anesthesiologist  Patient location: Pre-op. Preanesthetic checklist: patient identified, monitors and equipment checked and anesthesia consent Emergency situation Left, radial was placed Catheter size: 20 G  Attempts: 1 Procedure performed without using ultrasound guided technique. Following insertion, dressing applied. Post procedure assessment: normal and unchanged  Patient tolerated the procedure well with no immediate complications. Additional procedure comments: Line placed emergently due to uncontrolled surgical bleeding and hypotension. Initially connected to pressure tubing and used for type and cross. Connected to transducer once A line transducer set arrived to OR.Marland Kitchen

## 2021-11-21 NOTE — OR Nursing (Signed)
Wife given update that procedure was still in progress and had been more difficult than anticipated.  Updated that su

## 2021-11-21 DEATH — deceased

## 2021-11-23 MED FILL — Dextrose Inj 50%: INTRAVENOUS | Qty: 50 | Status: AC

## 2021-11-23 MED FILL — Calcium Chloride Inj 10%: INTRAVENOUS | Qty: 10 | Status: AC

## 2021-11-23 MED FILL — Epinephrine Soln Prefilled Syringe 0.1 MG/10ML (10 MCG/ML): INTRAVENOUS | Qty: 10 | Status: AC

## 2022-03-19 ENCOUNTER — Encounter: Payer: Self-pay | Admitting: *Deleted

## 2022-03-19 NOTE — Telephone Encounter (Signed)
This encounter was created in error - please disregard.

## 2022-03-24 IMAGING — MR MR HEAD W/O CM
6 of 10 series · 27 of 48 positions shown · non-contrast
Comparison: None.

CLINICAL DATA: Dizziness, persistent/recurrent, cardiac or vascular
cause suspected. Hx of clotting disorder, vertigo x 2 days, evaluate
for cerebellar stroke.

EXAM:
MRI HEAD WITHOUT CONTRAST
TECHNIQUE: Multiplanar, multiecho pulse sequences of the brain and surrounding
structures were obtained without intravenous contrast.

[Series 2: DWI · axial · 3.0mm · 0.94mm/px · z∈[-81,+65]mm · 8 of 100 slices shown (1 of 2)]
[im 1/100]
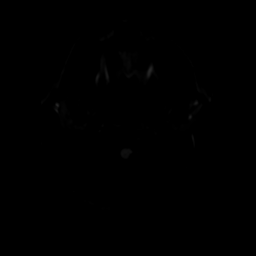
[im 12/100]
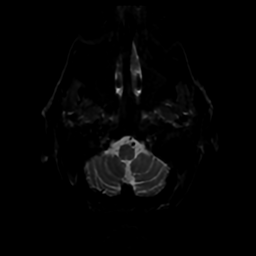
[im 34/100]
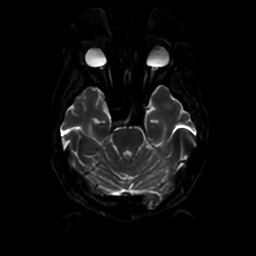
[im 45/100]
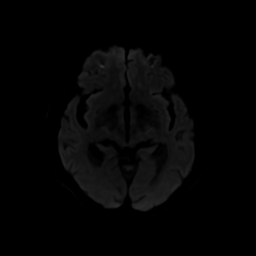
[im 56/100]
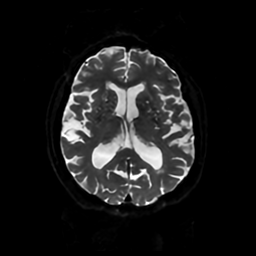
[im 67/100]
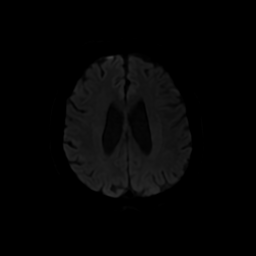
[im 89/100]
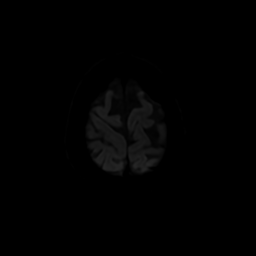
[im 100/100]
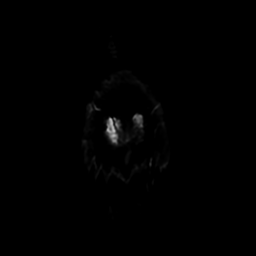

[Series 3: DWI · coronal · 4.0mm · 0.94mm/px · 6 of 68 slices shown (2 of 2)]
[im 1/68]
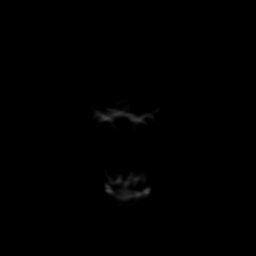
[im 14/68]
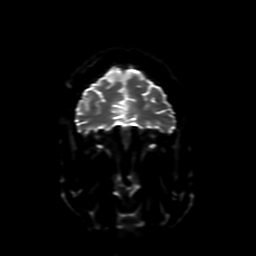
[im 27/68]
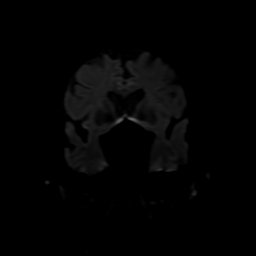
[im 41/68]
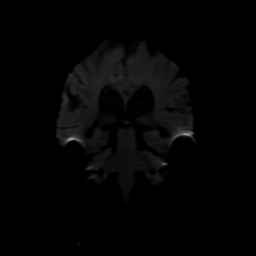
[im 54/68]
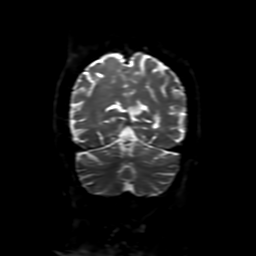
[im 68/68]
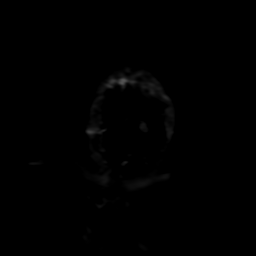

[Series 4: FLAIR · sagittal · 5.0mm · 0.23mm/px · 2 of 23 slices shown (1 of 2)]
[im 1/23]
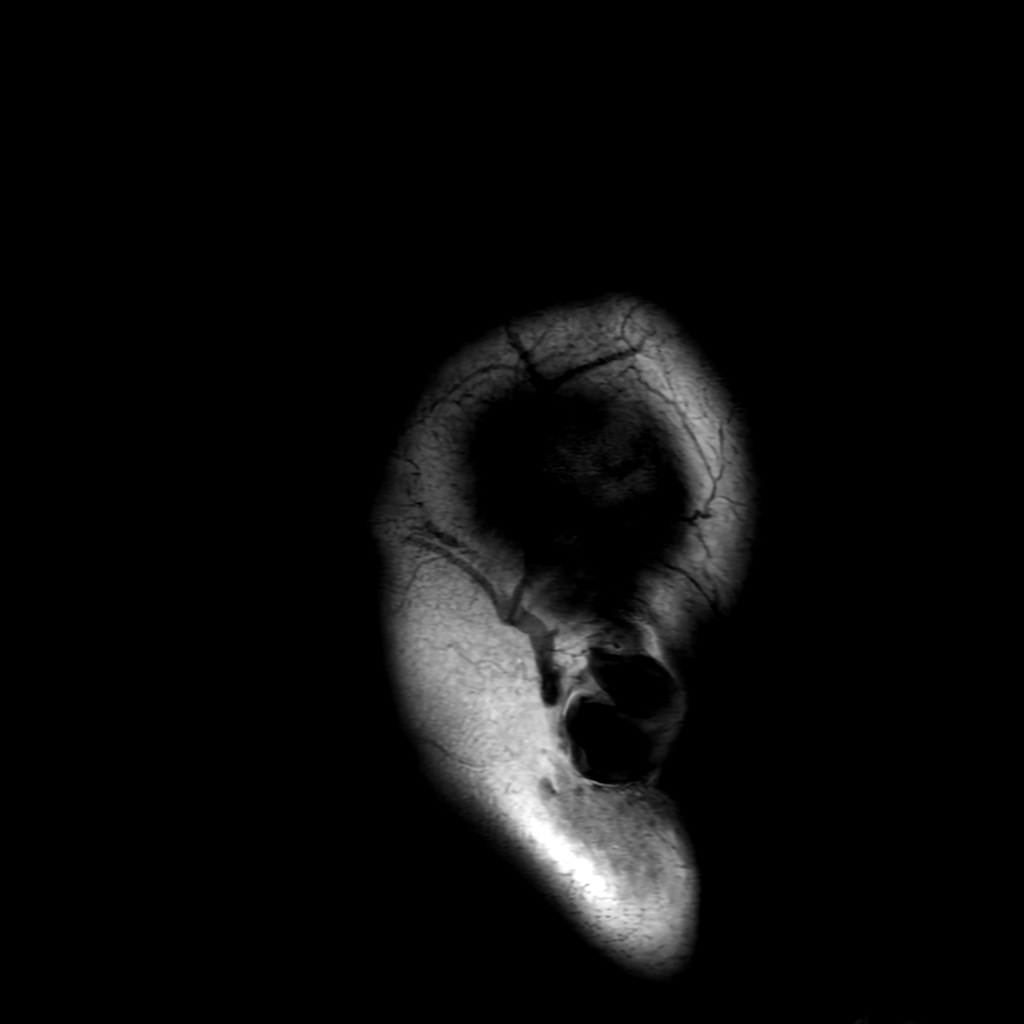
[im 23/23]
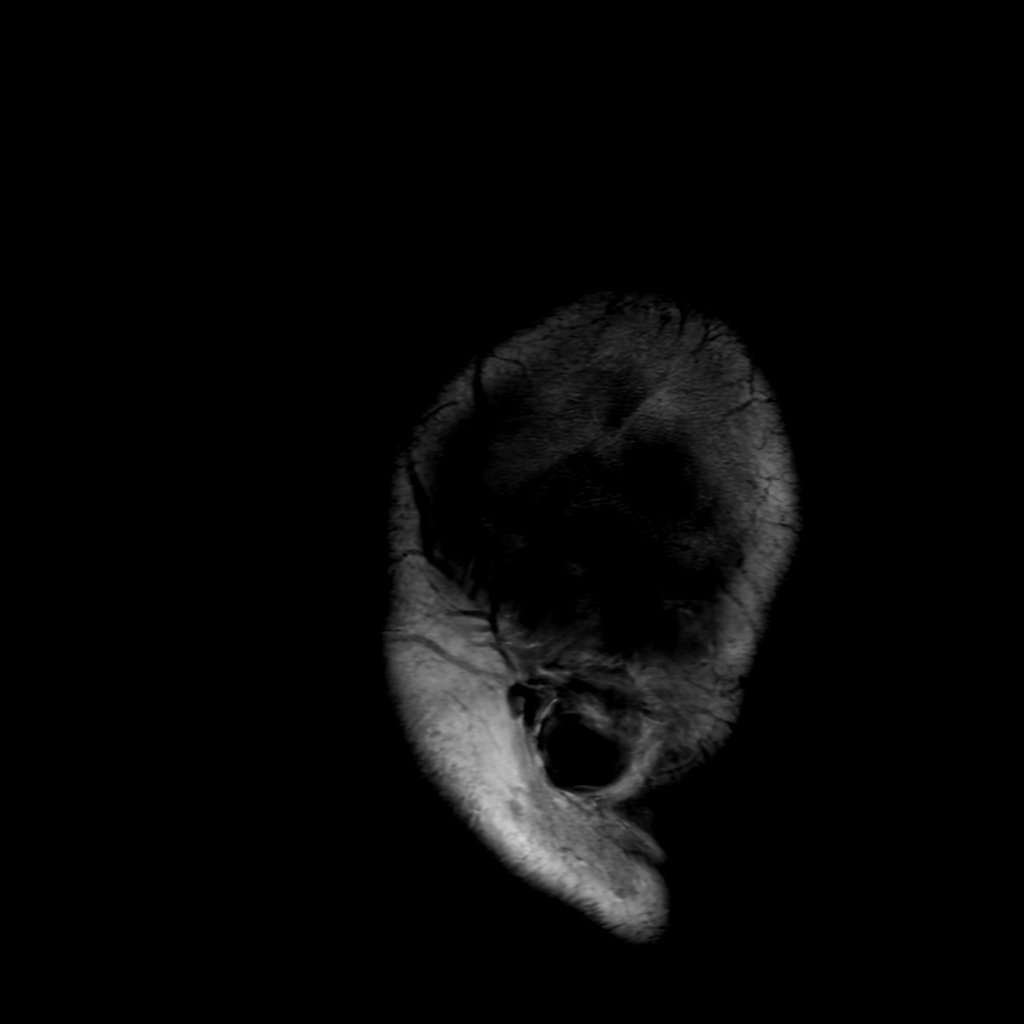

[Series 6: FLAIR · axial · 4.0mm · 0.45mm/px · z∈[-86,+63]mm · 3 of 35 slices shown (2 of 2)]
[im 1/35]
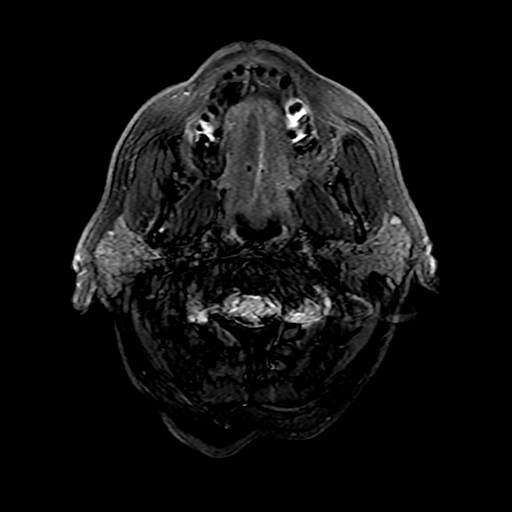
[im 18/35]
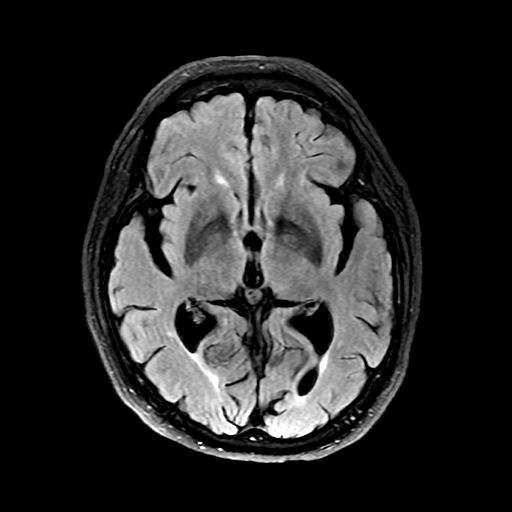
[im 35/35]
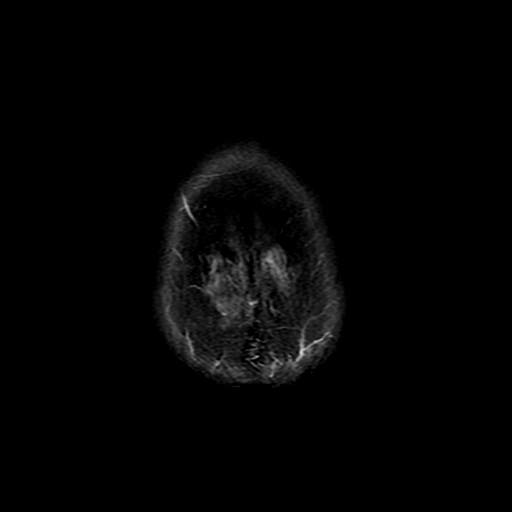

[Series 250: ADC · axial · 3.0mm · 0.94mm/px · z∈[-81,+65]mm · 5 of 50 slices shown (1 of 2)]
[im 1/50]
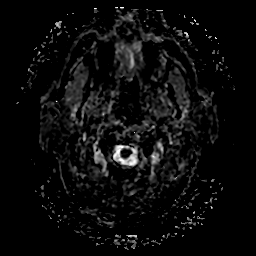
[im 13/50]
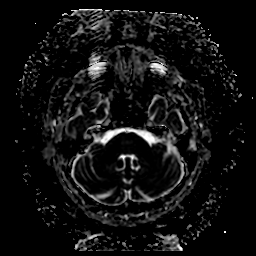
[im 25/50]
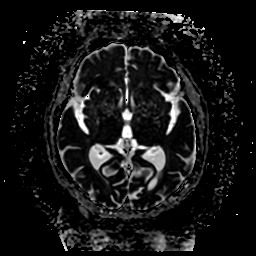
[im 37/50]
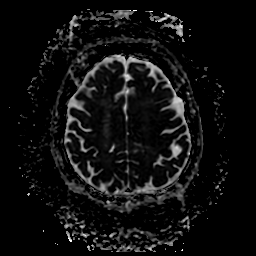
[im 50/50]
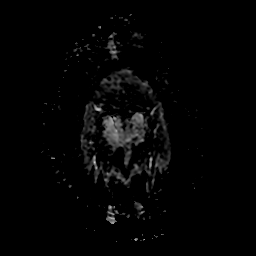

[Series 350: ADC · coronal · 4.0mm · 0.94mm/px · 3 of 35 slices shown (2 of 2)]
[im 1/35]
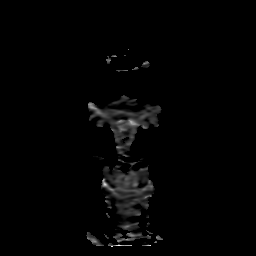
[im 18/35]
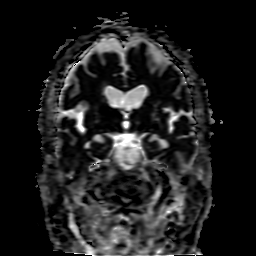
[im 35/35]
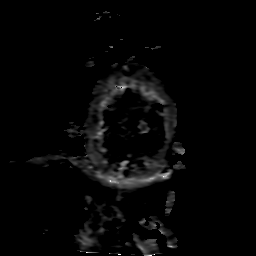

[27 of 48 positions shown; findings below may reference images not displayed]

FINDINGS: Brain: There is no evidence of an acute infarct, intracranial
hemorrhage, mass, midline shift, or extra-axial fluid collection. T2
hyperintensities in the cerebral white matter and pons are
nonspecific but compatible with mild chronic small vessel ischemic
disease. T2 hyperintensities throughout the basal ganglia
bilaterally predominantly reflect dilated perivascular spaces
although there may be a couple of chronic lacunar infarcts as well.
There is moderate cerebral atrophy.

Vascular: Major intracranial vascular flow voids are preserved.

Skull and upper cervical spine: 9 mm T1 hypointense focus in the
right parietal skull, indeterminate though possibly a small
hemangioma given its appearance on T2 weighted imaging. No
additional skull lesions to strongly suggest a more aggressive
process such as metastatic disease or myeloma although these are not
excluded.

Sinuses/Orbits: Bilateral cataract extraction. Clear paranasal
sinuses. Trace left mastoid effusion.

Other: None.
IMPRESSION: 1. No acute intracranial abnormality.
2. Mild chronic small vessel ischemic disease and moderate cerebral
atrophy.
